# Patient Record
Sex: Female | Born: 1935 | Race: White | Hispanic: No | Smoking: Never smoker
Health system: Southern US, Community
[De-identification: ages and names within clinical notes are randomized; demographics above are authoritative.]

## PROBLEM LIST (undated history)

## (undated) DIAGNOSIS — I10 Essential (primary) hypertension: Secondary | ICD-10-CM

## (undated) DIAGNOSIS — K219 Gastro-esophageal reflux disease without esophagitis: Secondary | ICD-10-CM

## (undated) DIAGNOSIS — H409 Unspecified glaucoma: Secondary | ICD-10-CM

## (undated) DIAGNOSIS — H269 Unspecified cataract: Secondary | ICD-10-CM

## (undated) DIAGNOSIS — K649 Unspecified hemorrhoids: Secondary | ICD-10-CM

## (undated) DIAGNOSIS — IMO0002 Reserved for concepts with insufficient information to code with codable children: Secondary | ICD-10-CM

## (undated) DIAGNOSIS — N816 Rectocele: Secondary | ICD-10-CM

## (undated) DIAGNOSIS — I639 Cerebral infarction, unspecified: Secondary | ICD-10-CM

## (undated) DIAGNOSIS — F419 Anxiety disorder, unspecified: Secondary | ICD-10-CM

## (undated) DIAGNOSIS — T7840XA Allergy, unspecified, initial encounter: Secondary | ICD-10-CM

## (undated) HISTORY — DX: Cerebral infarction, unspecified: I63.9

## (undated) HISTORY — PX: EYE SURGERY: SHX253

## (undated) HISTORY — DX: Anxiety disorder, unspecified: F41.9

## (undated) HISTORY — DX: Rectocele: N81.6

## (undated) HISTORY — DX: Unspecified glaucoma: H40.9

## (undated) HISTORY — DX: Allergy, unspecified, initial encounter: T78.40XA

## (undated) HISTORY — DX: Essential (primary) hypertension: I10

## (undated) HISTORY — DX: Unspecified hemorrhoids: K64.9

## (undated) HISTORY — DX: Reserved for concepts with insufficient information to code with codable children: IMO0002

## (undated) HISTORY — DX: Gastro-esophageal reflux disease without esophagitis: K21.9

## (undated) HISTORY — DX: Unspecified cataract: H26.9

---

## 2003-01-10 ENCOUNTER — Ambulatory Visit (HOSPITAL_COMMUNITY): Admission: RE | Admit: 2003-01-10 | Discharge: 2003-01-10 | Payer: Self-pay | Admitting: Pulmonary Disease

## 2005-08-14 ENCOUNTER — Ambulatory Visit (HOSPITAL_COMMUNITY): Admission: RE | Admit: 2005-08-14 | Discharge: 2005-08-14 | Payer: Self-pay | Admitting: Pulmonary Disease

## 2007-04-30 ENCOUNTER — Ambulatory Visit (HOSPITAL_COMMUNITY): Admission: RE | Admit: 2007-04-30 | Discharge: 2007-04-30 | Payer: Self-pay | Admitting: Pulmonary Disease

## 2008-04-19 ENCOUNTER — Encounter: Payer: Self-pay | Admitting: Orthopedic Surgery

## 2008-04-19 ENCOUNTER — Ambulatory Visit (HOSPITAL_COMMUNITY): Admission: RE | Admit: 2008-04-19 | Discharge: 2008-04-19 | Payer: Self-pay | Admitting: Pulmonary Disease

## 2008-06-29 ENCOUNTER — Encounter (HOSPITAL_COMMUNITY): Admission: RE | Admit: 2008-06-29 | Discharge: 2008-07-29 | Payer: Self-pay | Admitting: Pulmonary Disease

## 2008-10-11 ENCOUNTER — Ambulatory Visit (HOSPITAL_COMMUNITY): Admission: RE | Admit: 2008-10-11 | Discharge: 2008-10-11 | Payer: Self-pay | Admitting: Pulmonary Disease

## 2008-11-02 ENCOUNTER — Other Ambulatory Visit: Admission: RE | Admit: 2008-11-02 | Discharge: 2008-11-02 | Payer: Self-pay | Admitting: Obstetrics and Gynecology

## 2008-12-01 HISTORY — PX: HIP SURGERY: SHX245

## 2008-12-08 ENCOUNTER — Encounter: Payer: Self-pay | Admitting: Orthopedic Surgery

## 2008-12-12 ENCOUNTER — Encounter: Payer: Self-pay | Admitting: Orthopedic Surgery

## 2008-12-12 ENCOUNTER — Ambulatory Visit (HOSPITAL_COMMUNITY): Admission: RE | Admit: 2008-12-12 | Discharge: 2008-12-12 | Payer: Self-pay | Admitting: Pulmonary Disease

## 2009-01-10 ENCOUNTER — Ambulatory Visit: Payer: Self-pay | Admitting: Orthopedic Surgery

## 2009-01-10 DIAGNOSIS — M169 Osteoarthritis of hip, unspecified: Secondary | ICD-10-CM | POA: Insufficient documentation

## 2009-01-10 DIAGNOSIS — M25559 Pain in unspecified hip: Secondary | ICD-10-CM | POA: Insufficient documentation

## 2009-01-10 DIAGNOSIS — M1610 Unilateral primary osteoarthritis, unspecified hip: Secondary | ICD-10-CM | POA: Insufficient documentation

## 2009-01-11 ENCOUNTER — Encounter: Payer: Self-pay | Admitting: Orthopedic Surgery

## 2009-01-22 ENCOUNTER — Ambulatory Visit: Payer: Self-pay | Admitting: Orthopedic Surgery

## 2009-02-20 ENCOUNTER — Ambulatory Visit: Payer: Self-pay | Admitting: Orthopedic Surgery

## 2009-02-20 ENCOUNTER — Inpatient Hospital Stay (HOSPITAL_COMMUNITY): Admission: RE | Admit: 2009-02-20 | Discharge: 2009-02-25 | Payer: Self-pay | Admitting: Orthopedic Surgery

## 2009-02-20 ENCOUNTER — Telehealth: Payer: Self-pay | Admitting: Orthopedic Surgery

## 2009-02-22 ENCOUNTER — Encounter: Payer: Self-pay | Admitting: Orthopedic Surgery

## 2009-02-26 ENCOUNTER — Encounter: Payer: Self-pay | Admitting: Orthopedic Surgery

## 2009-02-26 ENCOUNTER — Telehealth: Payer: Self-pay | Admitting: Orthopedic Surgery

## 2009-03-05 ENCOUNTER — Telehealth: Payer: Self-pay | Admitting: Orthopedic Surgery

## 2009-03-06 ENCOUNTER — Ambulatory Visit: Payer: Self-pay | Admitting: Orthopedic Surgery

## 2009-03-06 DIAGNOSIS — Z96649 Presence of unspecified artificial hip joint: Secondary | ICD-10-CM | POA: Insufficient documentation

## 2009-03-11 ENCOUNTER — Encounter: Payer: Self-pay | Admitting: Orthopedic Surgery

## 2009-03-12 ENCOUNTER — Encounter: Payer: Self-pay | Admitting: Orthopedic Surgery

## 2009-03-15 ENCOUNTER — Encounter: Payer: Self-pay | Admitting: Orthopedic Surgery

## 2009-03-28 ENCOUNTER — Telehealth: Payer: Self-pay | Admitting: Orthopedic Surgery

## 2009-03-29 ENCOUNTER — Encounter: Payer: Self-pay | Admitting: Orthopedic Surgery

## 2009-04-03 ENCOUNTER — Ambulatory Visit: Payer: Self-pay | Admitting: Orthopedic Surgery

## 2009-04-11 ENCOUNTER — Encounter (INDEPENDENT_AMBULATORY_CARE_PROVIDER_SITE_OTHER): Payer: Self-pay | Admitting: *Deleted

## 2009-04-12 ENCOUNTER — Encounter: Payer: Self-pay | Admitting: Orthopedic Surgery

## 2009-04-12 ENCOUNTER — Encounter (HOSPITAL_COMMUNITY): Admission: RE | Admit: 2009-04-12 | Discharge: 2009-05-12 | Payer: Self-pay | Admitting: Orthopedic Surgery

## 2009-04-23 ENCOUNTER — Encounter: Payer: Self-pay | Admitting: Orthopedic Surgery

## 2009-05-03 ENCOUNTER — Encounter: Payer: Self-pay | Admitting: Orthopedic Surgery

## 2009-05-08 ENCOUNTER — Telehealth: Payer: Self-pay | Admitting: Orthopedic Surgery

## 2009-05-16 ENCOUNTER — Ambulatory Visit: Payer: Self-pay | Admitting: Orthopedic Surgery

## 2009-05-18 ENCOUNTER — Ambulatory Visit (HOSPITAL_COMMUNITY): Admission: RE | Admit: 2009-05-18 | Discharge: 2009-05-18 | Payer: Self-pay | Admitting: Pulmonary Disease

## 2010-03-17 IMAGING — CT CT PELVIS W/ CM
1 of 3 series · 14 of 32 positions shown, 19 images · IV contrast (Omnipaque 300)
Comparison: 08/14/2005

CT ABDOMEN

CLINICAL DATA: Left flank pain and left groin pain

CT ABDOMEN AND PELVIS WITHOUT CONTRAST
TECHNIQUE: Multidetector CT imaging of the abdomen and pelvis was
performed following the standard
protocol without intravenous contrast. Sagittal and coronal images
reconstructed from axial data set.

[Series 2: abd_pel 5.0 b40f · axial · 0.69mm/px · z∈[-449,-79]mm · 14 of 84 slices shown, 19 images]
[im 5/84  soft-tissue]
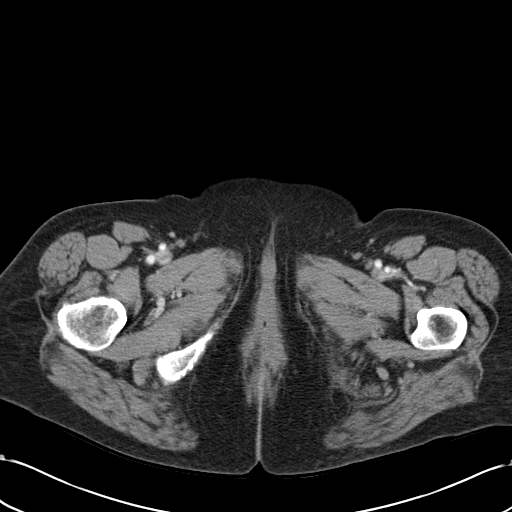
[im 5/84  bone]
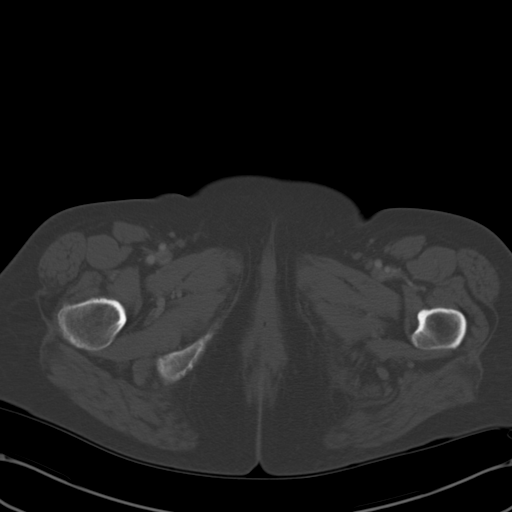
[im 10/84  soft-tissue]
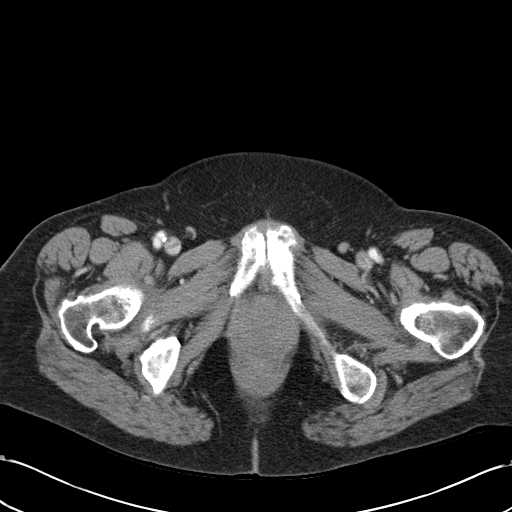
[im 19/84  soft-tissue]
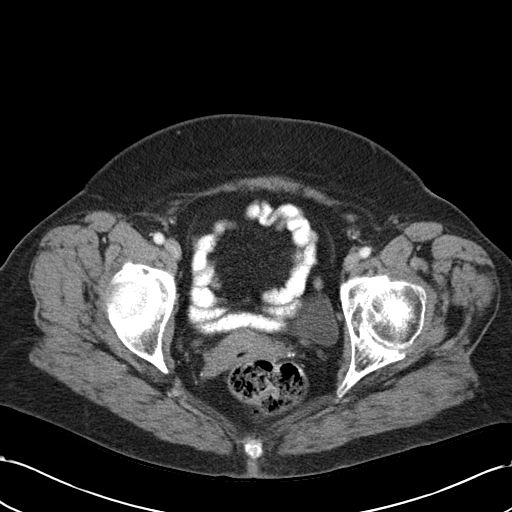
[im 24/84  soft-tissue]
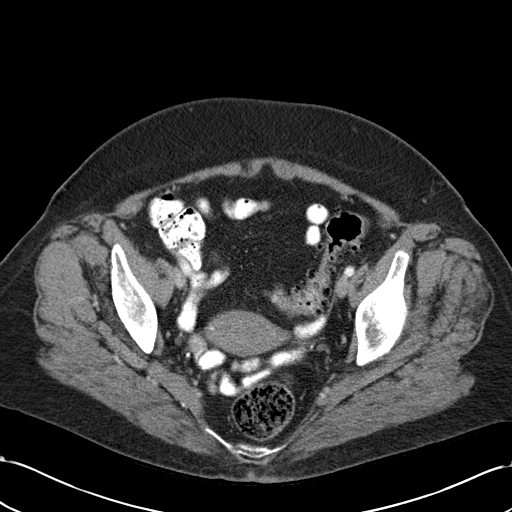
[im 28/84  soft-tissue]
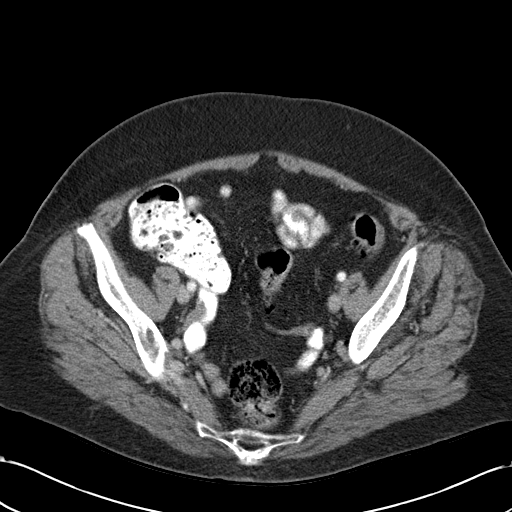
[im 37/84  soft-tissue]
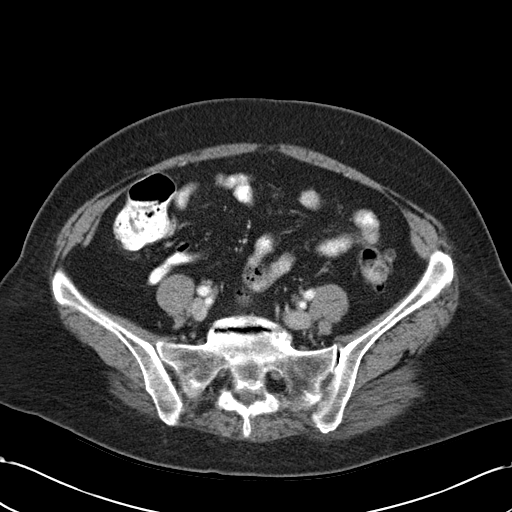
[im 42/84  soft-tissue]
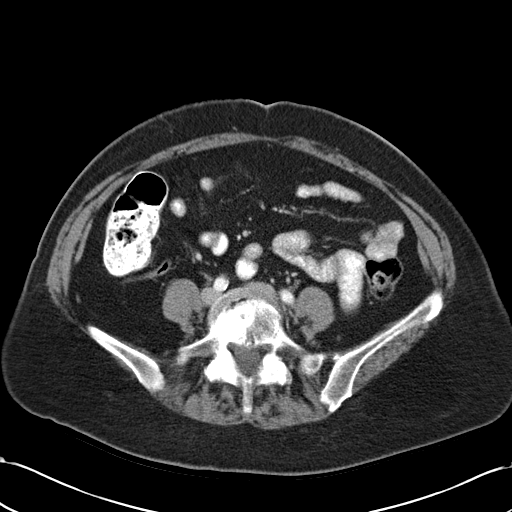
[im 47/84  soft-tissue]
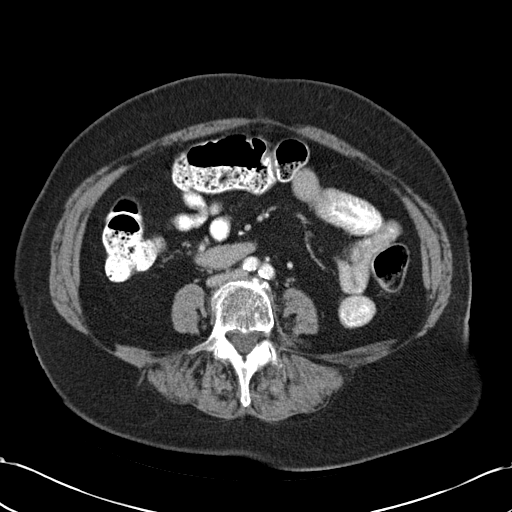
[im 56/84  soft-tissue]
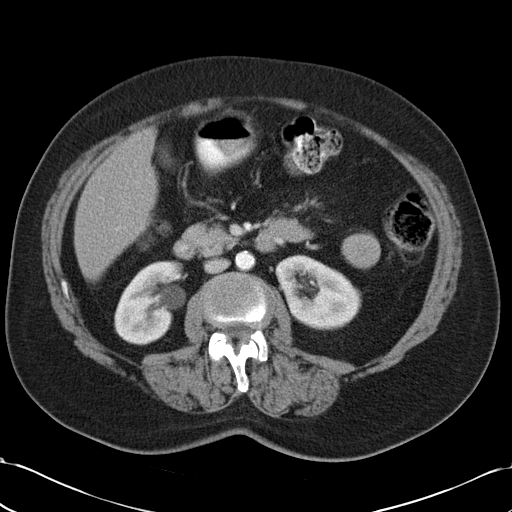
[im 56/84  bone]
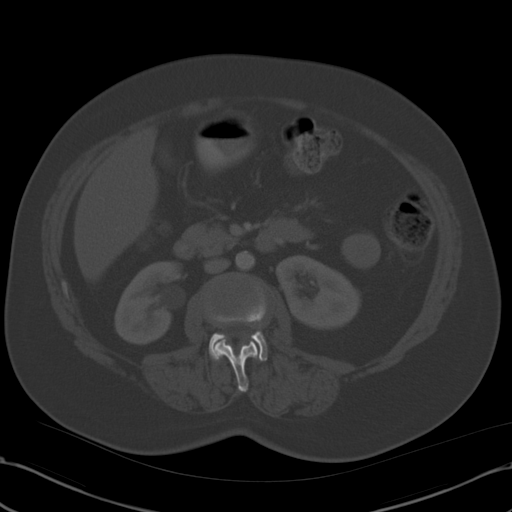
[im 60/84  soft-tissue]
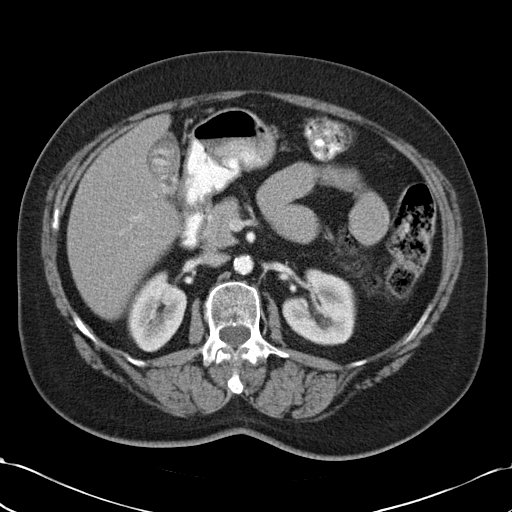
[im 65/84  soft-tissue]
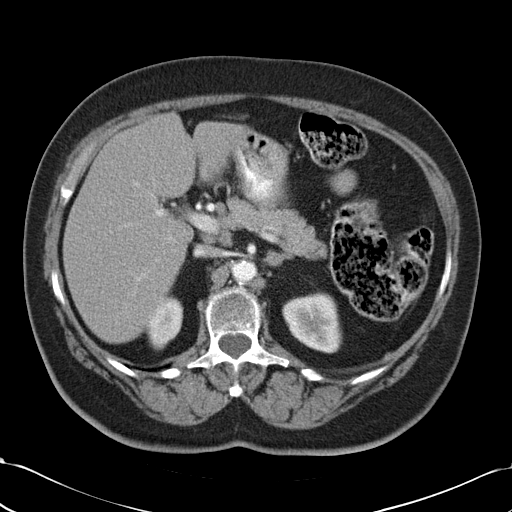
[im 65/84  lung]
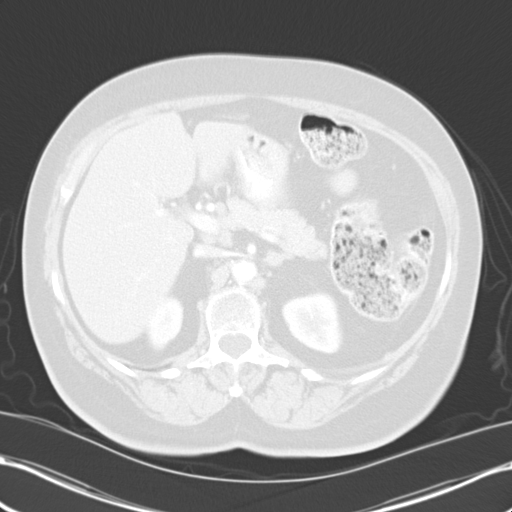
[im 70/84  lung]
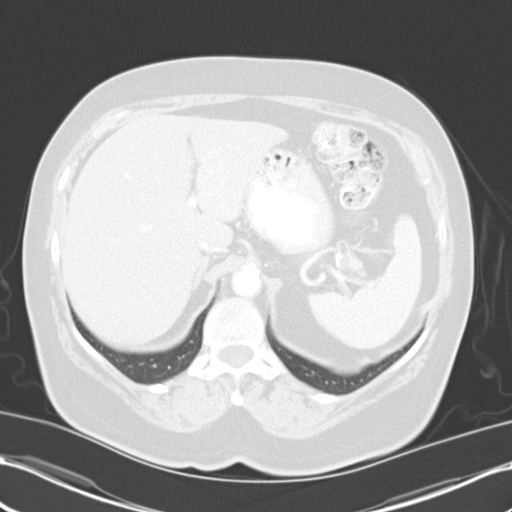
[im 74/84  soft-tissue]
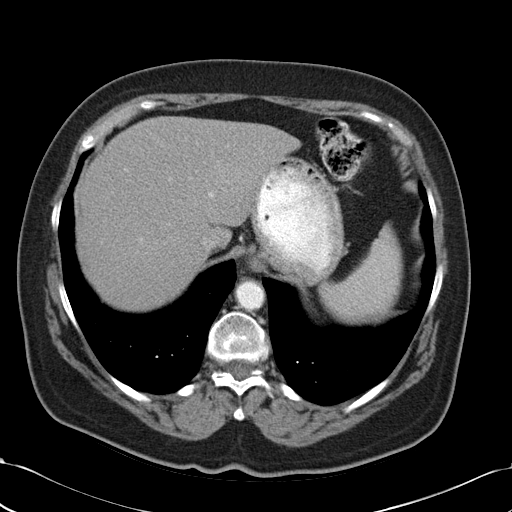
[im 74/84  lung]
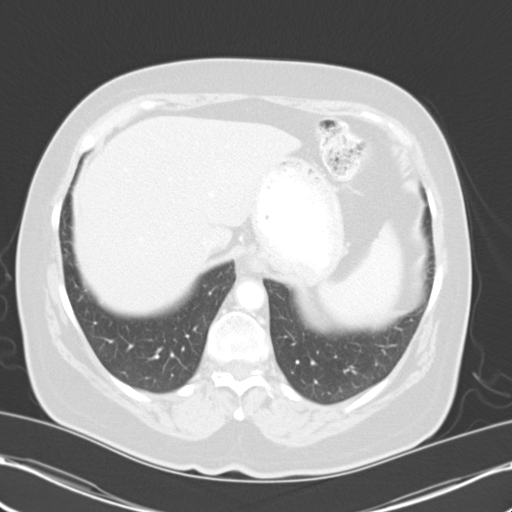
[im 79/84  soft-tissue]
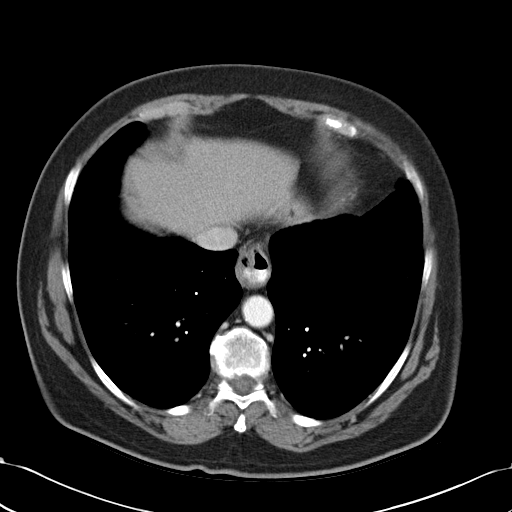
[im 79/84  lung]
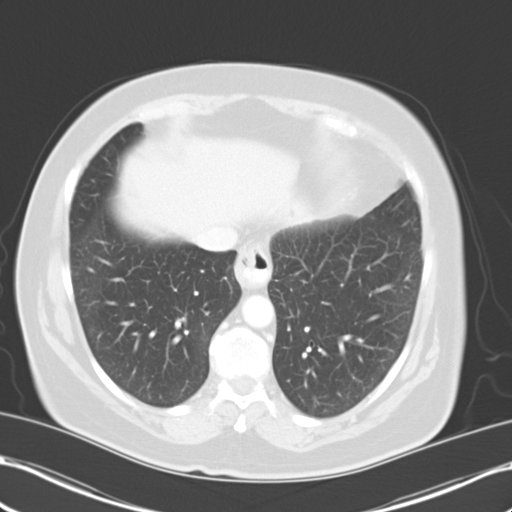

[14 of 32 positions shown; findings below may reference images not displayed]

FINDINGS: Tiny subpleural nodular density in lateral aspect left lower lobe
image one unchanged.
Tiny low attenuation nodule upper pole left kidney, 10 x 10 mm
image 13 unchanged.
Tiny benign appearing right adrenal calcification, question remote
hemorrhage or infection.
Liver, spleen, pancreas, kidneys, and adrenal glands otherwise
normal appearance.
Cholelithiasis.
No upper abdominal mass, adenopathy, free fluid, or inflammatory
process.
Stomach and upper abdominal bowel loops unremarkable.
Small hiatal hernia.
IMPRESSION: Small hiatal hernia.
Cholelithiasis
Tiny left renal cyst.
No acute upper abdominal abnormalities.

CT PELVIS
FINDINGS: Normal appendix, uterus, ovaries, and bladder.
Sigmoid diverticulosis without evidence of diverticulitis.
Distal ureters normal caliber without calculi.
No pelvic mass, adenopathy, or free fluid.
Multilevel degenerative disc disease changes lumbar spine, greatest
at L4-L5 and L5-S1 where end plate spurs, vacuum phenomenon, and
endplate sclerosis are noted.
Degenerative changes bilateral hips, greater on left.
IMPRESSION: No acute intrapelvic abnormalities.
Sigmoid diverticulosis.

## 2010-08-02 ENCOUNTER — Emergency Department (HOSPITAL_COMMUNITY): Admission: EM | Admit: 2010-08-02 | Discharge: 2010-08-02 | Payer: Self-pay | Admitting: Emergency Medicine

## 2010-12-22 ENCOUNTER — Encounter: Payer: Self-pay | Admitting: Pulmonary Disease

## 2011-03-13 LAB — DIFFERENTIAL
Basophils Absolute: 0 10*3/uL (ref 0.0–0.1)
Basophils Absolute: 0 10*3/uL (ref 0.0–0.1)
Eosinophils Absolute: 0.3 10*3/uL (ref 0.0–0.7)
Eosinophils Relative: 2 % (ref 0–5)
Eosinophils Relative: 3 % (ref 0–5)
Lymphocytes Relative: 13 % (ref 12–46)
Lymphocytes Relative: 14 % (ref 12–46)
Lymphocytes Relative: 14 % (ref 12–46)
Lymphs Abs: 1.5 10*3/uL (ref 0.7–4.0)
Monocytes Absolute: 1.2 10*3/uL — ABNORMAL HIGH (ref 0.1–1.0)
Monocytes Relative: 10 % (ref 3–12)
Monocytes Relative: 12 % (ref 3–12)
Neutro Abs: 6.7 10*3/uL (ref 1.7–7.7)
Neutro Abs: 7.6 10*3/uL (ref 1.7–7.7)
Neutrophils Relative %: 73 % (ref 43–77)
Neutrophils Relative %: 75 % (ref 43–77)

## 2011-03-13 LAB — BASIC METABOLIC PANEL
BUN: 7 mg/dL (ref 6–23)
CO2: 25 mEq/L (ref 19–32)
CO2: 26 mEq/L (ref 19–32)
Calcium: 8.3 mg/dL — ABNORMAL LOW (ref 8.4–10.5)
Calcium: 9.2 mg/dL (ref 8.4–10.5)
Chloride: 101 mEq/L (ref 96–112)
Chloride: 104 mEq/L (ref 96–112)
Chloride: 109 mEq/L (ref 96–112)
Creatinine, Ser: 0.68 mg/dL (ref 0.4–1.2)
Creatinine, Ser: 0.7 mg/dL (ref 0.4–1.2)
GFR calc Af Amer: >60 mL/min (ref >60)
GFR calc Af Amer: >60 mL/min (ref >60)
GFR calc Af Amer: >60 mL/min (ref >60)
GFR calc non Af Amer: >60 mL/min (ref >60)
Glucose, Bld: 113 mg/dL — ABNORMAL HIGH (ref 70–99)
Glucose, Bld: 129 mg/dL — ABNORMAL HIGH (ref 70–99)
Potassium: 4 mEq/L (ref 3.5–5.1)
Potassium: 4.1 mEq/L (ref 3.5–5.1)
Sodium: 136 mEq/L (ref 135–145)
Sodium: 137 mEq/L (ref 135–145)

## 2011-03-13 LAB — CROSSMATCH: ABO/RH(D): O POS

## 2011-03-13 LAB — CBC
HCT: 26.2 % — ABNORMAL LOW (ref 36.0–46.0)
HCT: 28.7 % — ABNORMAL LOW (ref 36.0–46.0)
Hemoglobin: 9 g/dL — ABNORMAL LOW (ref 12.0–15.0)
MCHC: 34.3 g/dL (ref 30.0–36.0)
MCV: 92.8 fL (ref 78.0–100.0)
Platelets: 130 10*3/uL — ABNORMAL LOW (ref 150–400)
Platelets: 142 10*3/uL — ABNORMAL LOW (ref 150–400)
RBC: 2.82 MIL/uL — ABNORMAL LOW (ref 3.87–5.11)
RBC: 2.82 MIL/uL — ABNORMAL LOW (ref 3.87–5.11)
RDW: 13.3 % (ref 11.5–15.5)
RDW: 13.4 % (ref 11.5–15.5)
WBC: 10.1 10*3/uL (ref 4.0–10.5)
WBC: 9.9 10*3/uL (ref 4.0–10.5)

## 2011-03-13 LAB — PREPARE RBC (CROSSMATCH)

## 2011-03-13 LAB — PROTIME-INR: Prothrombin Time: 12.4 seconds (ref 11.6–15.2)

## 2011-03-13 LAB — HEMOGLOBIN AND HEMATOCRIT, BLOOD: HCT: 38.1 % (ref 36.0–46.0)

## 2011-04-15 NOTE — Group Therapy Note (Signed)
[name], Krista Munoz               ACCOUNT NO.:  [id number]   MEDICAL RECORD NO.:  [id number]          PATIENT TYPE:  INP   LOCATION:  A315                          FACILITY:  [facility]   PHYSICIAN:  Vickki Hearing, M.D.DATE OF BIRTH:  July 13, 1931   DATE OF PROCEDURE:  DATE OF DISCHARGE:                                 PROGRESS NOTE   She is postop day 2, status post left total hip arthroplasty.  She is  scheduled for a CT scan this morning.  Her hemoglobin is now 9 after a  unit of blood yesterday.  We gave her a second unit today.  We would  like her to get up today, stand, maybe take a couple of steps, and start  preparing for discharge planning.  She is stable and improved after  stopping Dilaudid and going with hydrocodone as a pain reliever.      Vickki Hearing, M.D.  Electronically Signed     SEH/MEDQ  D:  02/22/2009  T:  02/22/2009  Job:  [id number]

## 2011-04-15 NOTE — Group Therapy Note (Signed)
NAMEALANTE, WEIMANN NO.:  [id number]   MEDICAL RECORD NO.:  [id number]          PATIENT TYPE:  INP   LOCATION:  A315                          FACILITY:  [facility]   PHYSICIAN:  Vickki Hearing, M.D.DATE OF BIRTH:  25-Mar-1931   DATE OF PROCEDURE:  02/23/2009  DATE OF DISCHARGE:                                 PROGRESS NOTE   Lillee Mooneyhan is postop day 3, status post left total hip arthroplasty.  Yesterday, we did a CT scan with three-dimensional reconstruction to  evaluate acetabular cup position, which showed she does have adequate  version in her acetabular cup, so she will continue to be on posterior  hip precautions.  Her safe arc of motion is 90 degrees of flexion and 20  degrees internal rotation.  She should not have any internal rotation  past that point.  On the operating table, she was stable to 40 degrees  of internal rotation.   Her wound looks good.  The pain pump catheter was removed.   Her lab studies show a hemoglobin of 9.8 after the second unit of blood.  Her metabolic panel was normal with a glucose of 113.   Orthopedic orders for her are full weightbearing, posterior hip  precautions, staples remove postop day #10-12, evaluate and remove  staples on appropriate day, 1 month of DVT prophylaxis with enoxaparin  40 mg subcu q.24 h., any questions call [phone].  The patient is having  appointment scheduled for March 06, 2009, in the office for checkup.      Vickki Hearing, M.D.  Electronically Signed     SEH/MEDQ  D:  02/23/2009  T:  02/24/2009  Job:  [id number]

## 2011-04-15 NOTE — Group Therapy Note (Signed)
[name], Krista Munoz               ACCOUNT NO.:  [id number]   MEDICAL RECORD NO.:  [id number]         PATIENT TYPE:  PINP   LOCATION:  A315                          FACILITY:  [facility]   PHYSICIAN:  Vickki Hearing, M.D.DATE OF BIRTH:  09/09/1931   DATE OF PROCEDURE:  DATE OF DISCHARGE:                                 PROGRESS NOTE   Postop day 3, status post left total hip arthroplasty, status post three-  dimensional CT scan to assess cup position.  Cup position found to be 29  degrees of anteversion versus a native acetabulum of 21 degrees on the  opposite side indicating good cup position.   The patient is in good condition.  Her hemoglobin is 9.8.  Her basic  metabolic panel was essentially normal.  She had stable vital signs.  She had an intact, clean wound.  Pain pump catheter was removed.   POSTOP PLAN:  The patient will go home with her family.  She will go  home on Coumadin, and that will stay for one month.  She is full  weightbearing as tolerated.  She is on posterior hip precautions with no  internal rotation and no hip flexion past 90 degrees.   Abduction pillow stays for 6 weeks.  TED hose stays for 6 weeks.  Coumadin continuous for 30 days postop from surgery.      Vickki Hearing, M.D.  Electronically Signed     SEH/MEDQ  D:  02/23/2009  T:  02/24/2009  Job:  [id number]

## 2011-04-15 NOTE — Op Note (Signed)
NAMELINNEA, TODISCO               ACCOUNT NO.:  [id number]   MEDICAL RECORD NO.:  [id number]          PATIENT TYPE:  INP   LOCATION:  A315                          FACILITY:  [facility]   PHYSICIAN:  Vickki Hearing, M.D.DATE OF BIRTH:  05/26/1931   DATE OF PROCEDURE:  DATE OF DISCHARGE:                               OPERATIVE REPORT   ADDENDUM   OPERATIVE FINDINGS:  Severe arthritis of the hip joint with severe  osteophyte formation, valgus deformity of the femoral neck, anterior and  posterior osteophytes obscuring the orientation of the acetabulum.  Severe wear of the femoral head as well as the acetabulum.  Hypertrophic  redundant capsule, which was hyperemic.      Vickki Hearing, M.D.  Electronically Signed     SEH/MEDQ  D:  02/20/2009  T:  02/21/2009  Job:  [id number]

## 2011-04-15 NOTE — Group Therapy Note (Signed)
[name], Krista Munoz               ACCOUNT NO.:  [id number]   MEDICAL RECORD NO.:  [id number]          PATIENT TYPE:  INP   LOCATION:  A315                          FACILITY:  [facility]   PHYSICIAN:  Vickki Hearing, M.D.DATE OF BIRTH:  01-04-1931   DATE OF PROCEDURE:  DATE OF DISCHARGE:                                 PROGRESS NOTE   She is postop day #1, status post left total hip arthroplasty.  She has  had some low blood pressure, hemoglobin is 8.8.  She will be on Lovenox  and foot pumps.  She is doing well, having mild-to-moderate pain,  controlled with PCA.   I would like to hold the Benicar this morning, transfuse a unit of  blood, we will get a 3-D CAT scan tomorrow to assess the position of her  cups until it is difficult to assess interop and she can get out of bed  to chair today with posterior hip precautions as noted in the record.      Vickki Hearing, M.D.  Electronically Signed     SEH/MEDQ  D:  02/21/2009  T:  02/21/2009  Job:  [id number]

## 2011-04-15 NOTE — Op Note (Signed)
NAMESPARROW, SIRACUSA NO.:  [id number]   MEDICAL RECORD NO.:  [id number]          PATIENT TYPE:  INP   LOCATION:  A315                          FACILITY:  [facility]   PHYSICIAN:  Vickki Hearing, M.D.DATE OF BIRTH:  [birth year]   DATE OF PROCEDURE:  02/20/2009  DATE OF DISCHARGE:                               OPERATIVE REPORT   HISTORY:  This is a 77 year old female presented with disabling pain in  her left hip.  She was becoming almost non-ambulatory.  She was having  severe trouble with activities of daily living.  She was unresponsive to  pain medication.   PREOPERATIVE DIAGNOSIS:  Osteoarthritis, left hip.   POSTOPERATIVE DIAGNOSIS:  Osteoarthritis, left hip.   PROCEDURE:  Left total hip arthroplasty.   IMPLANTS:  DePuy 11 Corail stem, 36 hip ball, +12 high offset neck  with  a 10 degrees liner.  We used a 52 cup with 2 screws and we also used the  autogenous graft from the reaming of the acetabulum for a medial wall  insufficiency   DESCRIPTION OF PROCEDURE:  The patient was identified preoperatively.  She marked her left hip and then I countersigned it and looked at her  history and physical updated and she was taken to surgery where she had  a preoperative antibiotic, spinal anesthetic, Foley catheter and then  was placed in lateral decubitus position with the left side up with an  axillary roll.   Slightly curved incision was made over the greater trochanter, extended  proximally and distally.  Subcutaneous tissue was divided, fascia was  split in line with the skin incision and the abductors were removed from  the greater trochanter and subperiosteal fashion.  The abductors were  retracted superiorly and held with 2 Steinmann pins.  Hip was dislocated  anteriorly via capsulotomy and internal rotation.  Provisional neck cut  was made with a neck cutting guide leaving a generous neck length.  We  then removed the inner portion of the greater  trochanter, used a canal  finder to the find the canal, broached from a size 8 up to a size 11.   We then prepared the acetabulum for reaming by removing soft tissue  labrum and osteophytes, reamed up to a size 52, placed a trial 52 cup  with a 36 liner in place, we tried a 32 head and a 1.5 neck length up to  a size 13, but the hip continued to dislocate posteriorly so we changed  the cup version to increase the anteversion and at that point, I noticed  that the trochanter was impinging on the pelvis, so we went back up to a  36 hip ball, tried neck lengths up to 12.  I obtained good tension on  the abductors, but still had impinging trochanter on the pelvis.  We  then removed bone from the trochanter.  This improved our range of  motion.  We then accepted this and our implant position, throughout this  we took several x-rays to confirm position of the implants.   We also used a acetabular positioning device  to measure anteversion and  we did obtain approximately 15-20 degrees of anteversion for a combined  anteversion of 30 degrees.   The posterior 10 degrees lip liner was used posteriorly to improve hip  stability.   I then removed all trial components, placed the 52 cup with 2 screws, 10  degrees liner placed posteriorly.  We placed a 11 Corail stem, +12-36  hip ball then repeated the range of motion.  The hip dislocated at 90  degrees of flexion and 40 degrees of internal rotation. This matched the  maximum amount of ROM we obtained during the trial reduction.   The wound was irrigated.  The abductors were closed by passing sutures  through the trochanter and femur and then reattaching the abductors  anatomically.  We also placed running #1 braylon stitch to assist with  this.   I closed the fascia in interrupted fashion with #1 braylon.  We also  used  running stitches as well as.  We closed the subcu with 0 and 2-0  Monocryl.   Staples were applied to the skin.  We used a  pain pump catheter and 30  mL of Marcaine by injection.   The patient was placed in abduction pillow; Prior to doing this we did  check for leg lengths and they were equal.   POSTOPERATIVE PLAN:  The patient will have posterior hip precautions due  to the range of motion and stability achieved on the table, which will  include no flexion pass 90 for 6 weeks and no internal rotation of the  leg.  Otherwise postop plan will be as normal.   This was discussed with her daughter who is her primary go between,  between  herself and the physician.  We will obtain a postop three-  dimensional CAT scan to review the position of the cup.      Vickki Hearing, M.D.  Electronically Signed     SEH/MEDQ  D:  02/20/2009  T:  02/21/2009  Job:  [id number]

## 2011-04-15 NOTE — Group Therapy Note (Signed)
NAMEKALINDA, ROMANIELLO               ACCOUNT NO.:  [id number]   MEDICAL RECORD NO.:  [id number]          PATIENT TYPE:  INP   LOCATION:  A315                          FACILITY:  [facility]   PHYSICIAN:  Vickki Hearing, M.D.DATE OF BIRTH:  10-20-1931   DATE OF PROCEDURE:  DATE OF DISCHARGE:                                 PROGRESS NOTE   On postop day #3, status post left total hip arthroplasty, status post  CT scan evaluation of acetabular cup position.  Cup position found to be  normal.  The patient should be on posterior hip precautions with flexion  limited to 90 degrees, no internal rotation.   Safe arc of motion in the operating room was 90 degrees of flexion, and  40 degrees of internal rotation, but no internal rotation should be  performed with therapy and flexion should not go past 90 degrees.   Wound looks good.  Hemoglobin is 9.8 after 2 units of blood.   PLAN:  Full weightbearing, posterior hip precautions, limit flexion to  90 degrees.  Remove staples postop day 10-12.  Evaluate, remove as  appropriate.  One month prophylaxis with enoxaparin.  Follow up in a  month.  Call [phone] for any questions.      Vickki Hearing, M.D.  Electronically Signed     SEH/MEDQ  D:  02/23/2009  T:  02/24/2009  Job:  [id number]

## 2011-04-15 NOTE — Discharge Summary (Signed)
Krista Munoz, READER               ACCOUNT NO.:  [id number]   MEDICAL RECORD NO.:  [id number]          PATIENT TYPE:  INP   LOCATION:  A315                          FACILITY:  [facility]   PHYSICIAN:  Vickki Hearing, M.D.DATE OF BIRTH:  07/08/1931   DATE OF ADMISSION:  02/20/2009  DATE OF DISCHARGE:  03/28/2010LH                               DISCHARGE SUMMARY   ADMISSION DIAGNOSIS:  Osteoarthritis, left hip.   DISCHARGE DIAGNOSES:  1. Osteoarthritis, left hip.  2. Postoperative anemia.   OPERATIVE PROCEDURE:  Left total hip arthroplasty with Depuy Corail stem  and 52 Pinnacle cup metal-on-poly, 36 head and 12 neck.   SURGEON:  Vickki Hearing, MD   ANESTHETIC:  Spinal.   OPERATIVE FINDINGS:  Redundant capsule, acetabular dysplasia, multiple  osteophytes, and severe arthritis.   HOSPITAL COURSE:  The patient was admitted on 20 February 2009, underwent  left total hip arthroplasty, complicated by abnormal acetabular  morphology.   She did well on postop, had some anemia, had to be transfused 2 units of  blood, hemoglobin came up to 9.8, blood pressure medicine was held due  to low blood pressure, and this worked well as she responded with blood  pressures of 120-140/60-70.   She had a 3-D reconstruction CT scan of her hip postop to evaluate cup  position which showed that the cup was in 29 degrees of anteversion.   The patient did well postoperatively, progressively mobilizing well.   She will be discharged with the following medicines.  1. OsCal 500 mg daily.  2. Prevacid 15 mg daily.  3. Alphagan 1 drop to each eye twice a day.  4. Lumigan 0.03% at bedtime, both eyes.  5. Centrum Silver 1 a day.  6. Dorzolamide and timolol 2-0.5 to both eyes.  7. Xanax 0.5 mg half tablet daily.  8. Norco 5 mg p.r.n. for pain.  9. Coumadin 5 mg alternating with 7.5 mg every other day with 7.5 mg      on Monday, Wednesday, Friday, Sunday, and 5 on Tuesday, Thursday,      and  Saturday.   Protime needed on Mondays.  The patient will take Coumadin for 25 days.   The patient will follow up on March 06, 2009.   Discharged to home.   The patient has full weightbearing with posterior hip precautions and  limit flexion to 90 degrees with no internal rotation, abduction pillow  for 6 weeks, TED stockings for 6 weeks, may remove for an hour every 8  hours if needed.   CONDITION:  Improved.  Discharged to home.      Vickki Hearing, M.D.  Electronically Signed     SEH/MEDQ  D:  02/24/2009  T:  02/24/2009  Job:  [id number]

## 2013-05-06 DIAGNOSIS — Z961 Presence of intraocular lens: Secondary | ICD-10-CM | POA: Insufficient documentation

## 2013-09-05 ENCOUNTER — Other Ambulatory Visit (HOSPITAL_COMMUNITY)
Admission: RE | Admit: 2013-09-05 | Discharge: 2013-09-05 | Disposition: A | Discharge status: Home / Self Care | Payer: Medicare Other | Source: Ambulatory Visit | Attending: Adult Health | Admitting: Adult Health

## 2013-09-05 ENCOUNTER — Ambulatory Visit (INDEPENDENT_AMBULATORY_CARE_PROVIDER_SITE_OTHER): Payer: Medicare Other | Admitting: Adult Health

## 2013-09-05 ENCOUNTER — Encounter: Payer: Self-pay | Admitting: Adult Health

## 2013-09-05 VITALS — BP 112/66 | HR 72 | Ht 61.0 in | Wt 140.0 lb

## 2013-09-05 DIAGNOSIS — N816 Rectocele: Secondary | ICD-10-CM

## 2013-09-05 DIAGNOSIS — Z1212 Encounter for screening for malignant neoplasm of rectum: Secondary | ICD-10-CM

## 2013-09-05 DIAGNOSIS — IMO0002 Reserved for concepts with insufficient information to code with codable children: Secondary | ICD-10-CM

## 2013-09-05 DIAGNOSIS — Z1151 Encounter for screening for human papillomavirus (HPV): Secondary | ICD-10-CM | POA: Insufficient documentation

## 2013-09-05 DIAGNOSIS — K649 Unspecified hemorrhoids: Secondary | ICD-10-CM | POA: Insufficient documentation

## 2013-09-05 DIAGNOSIS — Z01419 Encounter for gynecological examination (general) (routine) without abnormal findings: Secondary | ICD-10-CM

## 2013-09-05 DIAGNOSIS — N814 Uterovaginal prolapse, unspecified: Secondary | ICD-10-CM | POA: Insufficient documentation

## 2013-09-05 DIAGNOSIS — Z124 Encounter for screening for malignant neoplasm of cervix: Secondary | ICD-10-CM

## 2013-09-05 HISTORY — DX: Rectocele: N81.6

## 2013-09-05 HISTORY — DX: Unspecified hemorrhoids: K64.9

## 2013-09-05 HISTORY — DX: Reserved for concepts with insufficient information to code with codable children: IMO0002

## 2013-09-05 LAB — HEMOCCULT GUIAC POC 1CARD (OFFICE): Fecal Occult Blood, POC: NEGATIVE

## 2013-09-05 NOTE — Patient Instructions (Addendum)
Physical in 2 years Get mammogram in [employer] with PCP Try prep H or anusol  supp or cream if needed NO heavy lifting, more than 20 lbs

## 2013-09-05 NOTE — Progress Notes (Signed)
Patient ID: Krista Munoz, female   DOB: [birth year], 79 y.o.   MRN: [id number] History of Present Illness: Krista Munoz is a 79 year old white female widowed in for pap and physical.She thinks she thinks" something has dropped down there".She does yard work and lifts heavy things and that is when she notices it.   Current Medications, Allergies, Past Medical History, Past Surgical History, Family History and Social History were reviewed in [employer] record.     Review of Systems: Patient denies any headaches, blurred vision, shortness of breath, chest pain, abdominal pain, problems with bowel movements, urination, or intercourse. She is not having sex, occasional may wet panties if has to go real bad in am, stool hard at times.No mood changes, no joint swelling.She moves well.Gasy at times.    Physical Exam:BP 112/66  Pulse 72  Ht 5\' 1"  (1.549 m)  Wt 140 lb (63.504 kg)  BMI 26.47 kg/m2 General:  Well developed, well nourished, no acute distress Skin:  Warm and dry Neck:  Midline trachea, normal thyroid, no carotid bruits heard Lungs; Clear to auscultation bilaterally Breast:  No dominant palpable mass, retraction, or nipple discharge Cardiovascular: Regular rate and rhythm Abdomen:  Soft, non tender, no hepatosplenomegaly Pelvic:  External genitalia is normal in appearance for age has a few comodones.  The vagina is atrophic in appearance. Has mild cystocele. The cervix is atrophic,pap with HPV performed.  Uterus is felt to be normal size, shape, and contour.  No adnexal masses or tenderness noted. Rectal: Good sphincter tone, no polyps,internal  hemorrhoids felt.  Hemoccult negative.+rectocele. Extremities:  No swelling  Noted, has some spider veins bilaterally Psych:  No mood changes, alert and cooperative,seems happy   Impression: Yearly gyn exam Cystocele Rectocele Hemorrhoids  Plan: Physical in 2 years if desired,if pap normal no more Get mammogram Labs  with PCP---Dr Juanetta Gosling Declines flu shot Review handout on cystocele and rectocele, no heavy lifting above 20 lbs Try preparation H or Anusol, can drink 1/2 cup prune juice daily or eat 4-5 prunes

## 2013-10-26 ENCOUNTER — Emergency Department (HOSPITAL_COMMUNITY): Payer: Medicare Other

## 2013-10-26 ENCOUNTER — Encounter (HOSPITAL_COMMUNITY): Payer: Self-pay | Admitting: Emergency Medicine

## 2013-10-26 ENCOUNTER — Emergency Department (HOSPITAL_COMMUNITY)
Admission: EM | Admit: 2013-10-26 | Discharge: 2013-10-26 | Disposition: A | Discharge status: Home / Self Care | Payer: Medicare Other | Attending: Emergency Medicine | Admitting: Emergency Medicine

## 2013-10-26 DIAGNOSIS — R0789 Other chest pain: Secondary | ICD-10-CM

## 2013-10-26 DIAGNOSIS — Z8742 Personal history of other diseases of the female genital tract: Secondary | ICD-10-CM | POA: Insufficient documentation

## 2013-10-26 DIAGNOSIS — Z9889 Other specified postprocedural states: Secondary | ICD-10-CM | POA: Insufficient documentation

## 2013-10-26 DIAGNOSIS — S59909A Unspecified injury of unspecified elbow, initial encounter: Secondary | ICD-10-CM | POA: Insufficient documentation

## 2013-10-26 DIAGNOSIS — F411 Generalized anxiety disorder: Secondary | ICD-10-CM | POA: Insufficient documentation

## 2013-10-26 DIAGNOSIS — I10 Essential (primary) hypertension: Secondary | ICD-10-CM | POA: Insufficient documentation

## 2013-10-26 DIAGNOSIS — S638X2A Sprain of other part of left wrist and hand, initial encounter: Secondary | ICD-10-CM

## 2013-10-26 DIAGNOSIS — S63519A Sprain of carpal joint of unspecified wrist, initial encounter: Secondary | ICD-10-CM | POA: Insufficient documentation

## 2013-10-26 DIAGNOSIS — M25552 Pain in left hip: Secondary | ICD-10-CM

## 2013-10-26 DIAGNOSIS — Y92410 Unspecified street and highway as the place of occurrence of the external cause: Secondary | ICD-10-CM | POA: Insufficient documentation

## 2013-10-26 DIAGNOSIS — S8392XA Sprain of unspecified site of left knee, initial encounter: Secondary | ICD-10-CM

## 2013-10-26 DIAGNOSIS — K219 Gastro-esophageal reflux disease without esophagitis: Secondary | ICD-10-CM | POA: Insufficient documentation

## 2013-10-26 DIAGNOSIS — S6990XA Unspecified injury of unspecified wrist, hand and finger(s), initial encounter: Secondary | ICD-10-CM | POA: Insufficient documentation

## 2013-10-26 DIAGNOSIS — Z8669 Personal history of other diseases of the nervous system and sense organs: Secondary | ICD-10-CM | POA: Insufficient documentation

## 2013-10-26 DIAGNOSIS — Z79899 Other long term (current) drug therapy: Secondary | ICD-10-CM | POA: Insufficient documentation

## 2013-10-26 DIAGNOSIS — IMO0002 Reserved for concepts with insufficient information to code with codable children: Secondary | ICD-10-CM | POA: Insufficient documentation

## 2013-10-26 DIAGNOSIS — S139XXA Sprain of joints and ligaments of unspecified parts of neck, initial encounter: Secondary | ICD-10-CM

## 2013-10-26 DIAGNOSIS — S298XXA Other specified injuries of thorax, initial encounter: Secondary | ICD-10-CM | POA: Insufficient documentation

## 2013-10-26 DIAGNOSIS — Y9389 Activity, other specified: Secondary | ICD-10-CM | POA: Insufficient documentation

## 2013-10-26 DIAGNOSIS — S8391XA Sprain of unspecified site of right knee, initial encounter: Secondary | ICD-10-CM

## 2013-10-26 DIAGNOSIS — J45909 Unspecified asthma, uncomplicated: Secondary | ICD-10-CM | POA: Insufficient documentation

## 2013-10-26 DIAGNOSIS — S0990XA Unspecified injury of head, initial encounter: Secondary | ICD-10-CM

## 2013-10-26 DIAGNOSIS — S79919A Unspecified injury of unspecified hip, initial encounter: Secondary | ICD-10-CM | POA: Insufficient documentation
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
## 2013-10-26 NOTE — ED Notes (Signed)
Pt still in Xray dept
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
## 2013-10-26 NOTE — ED Notes (Signed)
Pt cleared from LSB with Dr. Bebe Shaggy and NT Haywood Lasso using log roll

## 2013-10-26 NOTE — ED Notes (Signed)
Pt ambulated around nurses desk without difficulty

## 2013-10-26 NOTE — ED Notes (Signed)
denies hitting head, denies LOC

## 2013-10-26 NOTE — ED Notes (Signed)
Pt placed on LSB and c-collar PTA

## 2013-11-01 ENCOUNTER — Ambulatory Visit (INDEPENDENT_AMBULATORY_CARE_PROVIDER_SITE_OTHER): Payer: Medicare Other | Admitting: Orthopedic Surgery

## 2013-11-01 VITALS — BP 158/78 | Ht 61.0 in | Wt 142.0 lb

## 2013-11-01 DIAGNOSIS — M1990 Unspecified osteoarthritis, unspecified site: Secondary | ICD-10-CM

## 2013-11-01 DIAGNOSIS — M129 Arthropathy, unspecified: Secondary | ICD-10-CM

## 2013-11-01 MED ORDER — MENTHOL (TOPICAL ANALGESIC) 4 % EX GEL: CUTANEOUS | Status: DC

## 2013-11-01 NOTE — Patient Instructions (Signed)
APPLY BIOFREEZE 3 TIMES DAILY ON [facility] JOINT
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
## 2014-05-02 ENCOUNTER — Ambulatory Visit (HOSPITAL_COMMUNITY)
Admission: RE | Admit: 2014-05-02 | Discharge: 2014-05-02 | Disposition: A | Discharge status: Home / Self Care | Payer: Medicare HMO | Source: Ambulatory Visit | Attending: Pulmonary Disease | Admitting: Pulmonary Disease

## 2014-05-02 ENCOUNTER — Other Ambulatory Visit (HOSPITAL_COMMUNITY): Payer: Self-pay | Admitting: Pulmonary Disease

## 2014-05-02 DIAGNOSIS — R059 Cough, unspecified: Secondary | ICD-10-CM

## 2014-05-02 DIAGNOSIS — R05 Cough: Secondary | ICD-10-CM | POA: Insufficient documentation

## 2014-07-19 ENCOUNTER — Telehealth: Payer: Self-pay | Admitting: Orthopedic Surgery

## 2014-07-19 NOTE — Telephone Encounter (Signed)
Patient had called, and then came in, to request copy of the only office visit she had by Dr. Romeo AppleHarrison (date of service 11/01/13); request form signed, and scanned; notes printed by nurse; patient received as requested.

## 2014-10-02 ENCOUNTER — Encounter (HOSPITAL_COMMUNITY): Payer: Self-pay | Admitting: Emergency Medicine

## 2016-05-13 ENCOUNTER — Ambulatory Visit (HOSPITAL_COMMUNITY)
Admission: RE | Admit: 2016-05-13 | Discharge: 2016-05-13 | Disposition: A | Discharge status: Home / Self Care | Payer: Commercial Managed Care - HMO | Source: Ambulatory Visit | Attending: Pulmonary Disease | Admitting: Pulmonary Disease

## 2016-05-13 ENCOUNTER — Other Ambulatory Visit (HOSPITAL_COMMUNITY): Payer: Self-pay | Admitting: Pulmonary Disease

## 2016-05-13 DIAGNOSIS — M25561 Pain in right knee: Secondary | ICD-10-CM | POA: Diagnosis present

## 2016-05-13 DIAGNOSIS — M179 Osteoarthritis of knee, unspecified: Secondary | ICD-10-CM | POA: Diagnosis not present

## 2016-10-09 ENCOUNTER — Other Ambulatory Visit (HOSPITAL_COMMUNITY): Payer: Self-pay | Admitting: Pulmonary Disease

## 2016-10-09 ENCOUNTER — Emergency Department (HOSPITAL_COMMUNITY): Payer: Commercial Managed Care - HMO

## 2016-10-09 ENCOUNTER — Other Ambulatory Visit: Payer: Self-pay

## 2016-10-09 ENCOUNTER — Encounter (HOSPITAL_COMMUNITY): Payer: Self-pay

## 2016-10-09 ENCOUNTER — Inpatient Hospital Stay (HOSPITAL_COMMUNITY)
Admission: EM | Admit: 2016-10-09 | Discharge: 2016-10-11 | DRG: 065 | Disposition: A | Discharge status: Skilled Nursing Facility | Payer: Commercial Managed Care - HMO | Attending: Pulmonary Disease | Admitting: Pulmonary Disease

## 2016-10-09 ENCOUNTER — Ambulatory Visit (HOSPITAL_COMMUNITY)
Admission: RE | Admit: 2016-10-09 | Discharge: 2016-10-09 | Disposition: A | Discharge status: Home / Self Care | Payer: Commercial Managed Care - HMO | Source: Ambulatory Visit | Attending: Pulmonary Disease | Admitting: Pulmonary Disease

## 2016-10-09 DIAGNOSIS — R29898 Other symptoms and signs involving the musculoskeletal system: Secondary | ICD-10-CM

## 2016-10-09 DIAGNOSIS — I10 Essential (primary) hypertension: Secondary | ICD-10-CM | POA: Diagnosis not present

## 2016-10-09 DIAGNOSIS — Z841 Family history of disorders of kidney and ureter: Secondary | ICD-10-CM

## 2016-10-09 DIAGNOSIS — I639 Cerebral infarction, unspecified: Secondary | ICD-10-CM | POA: Diagnosis not present

## 2016-10-09 DIAGNOSIS — Z8261 Family history of arthritis: Secondary | ICD-10-CM

## 2016-10-09 DIAGNOSIS — H409 Unspecified glaucoma: Secondary | ICD-10-CM | POA: Diagnosis present

## 2016-10-09 DIAGNOSIS — Z79899 Other long term (current) drug therapy: Secondary | ICD-10-CM

## 2016-10-09 DIAGNOSIS — Z818 Family history of other mental and behavioral disorders: Secondary | ICD-10-CM

## 2016-10-09 DIAGNOSIS — Z96649 Presence of unspecified artificial hip joint: Secondary | ICD-10-CM | POA: Diagnosis present

## 2016-10-09 DIAGNOSIS — Z825 Family history of asthma and other chronic lower respiratory diseases: Secondary | ICD-10-CM

## 2016-10-09 DIAGNOSIS — Z823 Family history of stroke: Secondary | ICD-10-CM

## 2016-10-09 DIAGNOSIS — K219 Gastro-esophageal reflux disease without esophagitis: Secondary | ICD-10-CM | POA: Diagnosis present

## 2016-10-09 DIAGNOSIS — M25559 Pain in unspecified hip: Secondary | ICD-10-CM | POA: Diagnosis present

## 2016-10-09 DIAGNOSIS — Z885 Allergy status to narcotic agent status: Secondary | ICD-10-CM

## 2016-10-09 DIAGNOSIS — Z9842 Cataract extraction status, left eye: Secondary | ICD-10-CM

## 2016-10-09 DIAGNOSIS — G8191 Hemiplegia, unspecified affecting right dominant side: Secondary | ICD-10-CM

## 2016-10-09 DIAGNOSIS — E785 Hyperlipidemia, unspecified: Secondary | ICD-10-CM | POA: Diagnosis present

## 2016-10-09 DIAGNOSIS — Z8249 Family history of ischemic heart disease and other diseases of the circulatory system: Secondary | ICD-10-CM

## 2016-10-09 DIAGNOSIS — F411 Generalized anxiety disorder: Secondary | ICD-10-CM | POA: Diagnosis present

## 2016-10-09 DIAGNOSIS — Z886 Allergy status to analgesic agent status: Secondary | ICD-10-CM

## 2016-10-09 DIAGNOSIS — Z809 Family history of malignant neoplasm, unspecified: Secondary | ICD-10-CM

## 2016-10-09 DIAGNOSIS — R531 Weakness: Secondary | ICD-10-CM

## 2016-10-09 DIAGNOSIS — Z833 Family history of diabetes mellitus: Secondary | ICD-10-CM

## 2016-10-09 DIAGNOSIS — Z821 Family history of blindness and visual loss: Secondary | ICD-10-CM

## 2016-10-09 LAB — COMPREHENSIVE METABOLIC PANEL
ALBUMIN: 3.9 g/dL (ref 3.5–5.0)
ALK PHOS: 84 U/L (ref 38–126)
ALT: 14 U/L (ref 14–54)
ANION GAP: 9 (ref 5–15)
AST: 20 U/L (ref 15–41)
BILIRUBIN TOTAL: 0.5 mg/dL (ref 0.3–1.2)
BUN: 10 mg/dL (ref 6–20)
CALCIUM: 9.1 mg/dL (ref 8.9–10.3)
CO2: 23 mmol/L (ref 22–32)
CREATININE: 0.74 mg/dL (ref 0.44–1.00)
Chloride: 104 mmol/L (ref 101–111)
GFR calc Af Amer: >60 mL/min (ref >60)
GFR calc non Af Amer: >60 mL/min (ref >60)
GLUCOSE: 118 mg/dL — AB (ref 65–99)
Potassium: 3.2 mmol/L — ABNORMAL LOW (ref 3.5–5.1)
SODIUM: 136 mmol/L (ref 135–145)
TOTAL PROTEIN: 7.7 g/dL (ref 6.5–8.1)

## 2016-10-09 LAB — I-STAT CHEM 8, ED
BUN: 11 mg/dL (ref 6–20)
CALCIUM ION: 1.12 mmol/L — AB (ref 1.15–1.40)
CHLORIDE: 104 mmol/L (ref 101–111)
CREATININE: 0.7 mg/dL (ref 0.44–1.00)
Glucose, Bld: 117 mg/dL — ABNORMAL HIGH (ref 65–99)
HCT: 43 % (ref 36.0–46.0)
Hemoglobin: 14.6 g/dL (ref 12.0–15.0)
Potassium: 4 mmol/L (ref 3.5–5.1)
Sodium: 141 mmol/L (ref 135–145)
TCO2: 25 mmol/L (ref 0–100)

## 2016-10-09 LAB — I-STAT TROPONIN, ED: Troponin i, poc: 0.01 ng/mL (ref 0.00–0.08)

## 2016-10-09 LAB — DIFFERENTIAL
Basophils Absolute: 0 10*3/uL (ref 0.0–0.1)
Basophils Relative: 1 %
EOS PCT: 2 %
Eosinophils Absolute: 0.2 10*3/uL (ref 0.0–0.7)
LYMPHS ABS: 2.7 10*3/uL (ref 0.7–4.0)
LYMPHS PCT: 31 %
MONO ABS: 0.7 10*3/uL (ref 0.1–1.0)
Monocytes Relative: 8 %
NEUTROS ABS: 5.2 10*3/uL (ref 1.7–7.7)
NEUTROS PCT: 58 %

## 2016-10-09 LAB — PROTIME-INR
INR: 0.99
Prothrombin Time: 13 seconds (ref 11.4–15.2)

## 2016-10-09 LAB — ETHANOL: Alcohol, Ethyl (B): <5 mg/dL (ref <5)

## 2016-10-09 LAB — CBC
HCT: 40.4 % (ref 36.0–46.0)
HEMOGLOBIN: 13.6 g/dL (ref 12.0–15.0)
MCH: 30.6 pg (ref 26.0–34.0)
MCHC: 33.7 g/dL (ref 30.0–36.0)
MCV: 91 fL (ref 78.0–100.0)
PLATELETS: 267 10*3/uL (ref 150–400)
RBC: 4.44 MIL/uL (ref 3.87–5.11)
RDW: 13.4 % (ref 11.5–15.5)
WBC: 8.8 10*3/uL (ref 4.0–10.5)

## 2016-10-09 LAB — APTT: aPTT: 29 seconds (ref 24–36)

## 2016-10-09 MED ORDER — ATORVASTATIN CALCIUM 40 MG PO TABS
40.0000 mg | ORAL_TABLET | Freq: Every day | ORAL | Status: DC
  Administered 2016-10-10: 40 mg via ORAL

## 2016-10-09 NOTE — ED Notes (Signed)
Patient transported to MRI 

## 2016-10-09 NOTE — ED Provider Notes (Signed)
[facility]Y DEPT Provider Note   CSN: [id number] Arrival date & time: 10/09/16  1731     History   Chief Complaint No chief complaint on file.   HPI Krista Munoz is a 82 y.o. female.  Patient referred in by Dr. Juanetta Gosling. He had ordered a head CT based on complaint of right-sided weakness that started 2 days ago. Head CT showed evidence of an internal capsule acute or subacute stroke. Patient has persistent weakness on the right side no speech problems. No headache. Symptoms have not gotten worse. No prior history of stroke.      Past Medical History:  Diagnosis Date  . Allergy   . Anxiety   . Cataract   . Cystocele 09/05/2013  . GERD (gastroesophageal reflux disease)   . Glaucoma   . Hemorrhoids 09/05/2013  . Hypertension   . Rectocele 09/05/2013    Patient Active Problem List   Diagnosis Date Noted  . Acute CVA (cerebrovascular accident) (HCC) 10/09/2016  . HTN (hypertension) 10/09/2016  . Rectocele 09/05/2013  . Cystocele 09/05/2013  . Hemorrhoids 09/05/2013  . TOTAL HIP FOLLOW-UP 03/06/2009  . Osteoarthrosis, unspecified whether generalized or localized, pelvic region and thigh 01/10/2009  . HIP PAIN 01/10/2009    Past Surgical History:  Procedure Laterality Date  . EYE SURGERY Left    tube placed and cataract removed  . HIP SURGERY Left 2010    hip replacement.    OB History    Gravida Para Term Preterm AB Living   5 5       5    SAB TAB Ectopic Multiple Live Births           5       Home Medications    Prior to Admission medications   Medication Sig Start Date End Date Taking? Authorizing Provider  acetaminophen (TYLENOL) 325 MG tablet Take 325 mg by mouth every 6 (six) hours as needed for pain.    Historical Provider, MD  ALPRAZolam Prudy Feeler) 0.5 MG tablet Take 0.5 mg by mouth 3 (three) times daily. 09/03/16   Historical Provider, MD  bimatoprost (LUMIGAN) 0.03 % ophthalmic solution Place 1 drop into both eyes at bedtime.    Historical  Provider, MD  brimonidine (ALPHAGAN P) 0.1 % SOLN Place 1 drop into both eyes 3 (three) times daily.    Historical Provider, MD  dorzolamide-timolol (COSOPT) 22.3-6.8 MG/ML ophthalmic solution Place 1 drop into both eyes 2 (two) times daily.    Historical Provider, MD  lansoprazole (PREVACID) 15 MG capsule Take 15 mg by mouth daily.    Historical Provider, MD  loratadine (CLARITIN) 10 MG tablet Take 10 mg by mouth daily.    Historical Provider, MD  meclizine (ANTIVERT) 25 MG tablet Take 25 mg by mouth 3 (three) times daily as needed. 10/09/16   Historical Provider, MD  Menthol, Topical Analgesic, 4 % GEL Apply to [facility] JOINT THREE TIMES DAILY 11/01/13   Vickki Hearing, MD  Multiple Vitamins-Minerals (CENTRUM SILVER PO) Take 1 tablet by mouth daily.    Historical Provider, MD  olmesartan (BENICAR) 20 MG tablet Take 20 mg by mouth daily.    Historical Provider, MD  valsartan (DIOVAN) 160 MG tablet Take 160 mg by mouth daily. 09/19/16   Historical Provider, MD    Family History Family History  Problem Relation Age of Onset  . Cancer Mother   . Heart disease Father   . Heart attack Father   . Cancer Sister 62  breast   . Arthritis Daughter   . Asthma Daughter   . Diabetes Daughter   . Diabetes Paternal Grandmother   . Cancer Sister     colon   . Cancer Sister   . Stroke Sister   . Arthritis Sister   . Heart disease Sister   . Kidney disease Sister   . Cancer Sister     breast and lung   . Diabetes Sister   . Vision loss Brother   . Diabetes Brother   . Arthritis Son   . Alcohol abuse Son   . Alcohol abuse Son   . Arthritis Son   . Depression Son   . Mental illness Son     Social History Social History  Substance Use Topics  . Smoking status: Passive Smoke Exposure - Never Smoker  . Smokeless tobacco: Never Used  . Alcohol use No     Allergies   Morphine and related and Codeine   Review of Systems Review of Systems  Constitutional: Negative for fever.  HENT:  Negative for congestion.   Eyes: Negative for visual disturbance.  Respiratory: Negative for shortness of breath.   Cardiovascular: Negative for chest pain.  Gastrointestinal: Negative for abdominal pain.  Genitourinary: Negative for dysuria.  Musculoskeletal: Negative for back pain and neck pain.  Skin: Negative for rash.  Neurological: Positive for weakness. Negative for syncope, speech difficulty and headaches.  Hematological: Does not bruise/bleed easily.  Psychiatric/Behavioral: Negative for confusion.     Physical Exam Updated Vital Signs BP 149/65   Pulse 78   Temp 98.6 F (37 C) (Oral)   Resp 17   Ht 5\' 2"  (1.575 m)   Wt 68 kg   SpO2 92%   BMI 27.44 kg/m   Physical Exam  Constitutional: She is oriented to person, place, and time. She appears well-developed and well-nourished. No distress.  HENT:  Head: Normocephalic and atraumatic.  Mouth/Throat: Oropharynx is clear and moist.  Eyes: Conjunctivae and EOM are normal. Pupils are equal, round, and reactive to light.  Left eye with a pterygium.  Neck: Normal range of motion. Neck supple.  Cardiovascular: Normal rate, regular rhythm and normal heart sounds.   Pulmonary/Chest: Effort normal and breath sounds normal.  Abdominal: Soft. Bowel sounds are normal. There is no tenderness.  Musculoskeletal: Normal range of motion. She exhibits no edema.  Neurological: She is alert and oriented to person, place, and time. No cranial nerve deficit.  Mild weakness noted to the right side upper extremity lower extremity.  Skin: Skin is warm.  Nursing note and vitals reviewed.    ED Treatments / Results  Labs (all labs ordered are listed, but only abnormal results are displayed) Labs Reviewed  COMPREHENSIVE METABOLIC PANEL - Abnormal; Notable for the following:       Result Value   Potassium 3.2 (*)    Glucose, Bld 118 (*)    All other components within normal limits  I-STAT CHEM 8, ED - Abnormal; Notable for the  following:    Glucose, Bld 117 (*)    Calcium, Ion 1.12 (*)    All other components within normal limits  ETHANOL  PROTIME-INR  APTT  CBC  DIFFERENTIAL  RAPID URINE DRUG SCREEN, HOSP PERFORMED  URINALYSIS, ROUTINE W REFLEX MICROSCOPIC (NOT AT [facility])  LIPID PANEL  I-STAT TROPOININ, ED    EKG  EKG Interpretation None      ED ECG REPORT   Date: 10/09/2016  Rate: 75  Rhythm: normal sinus  rhythm  QRS Axis: normal  Intervals: normal  ST/T Wave abnormalities: nonspecific ST/T changes  Conduction Disutrbances:none  Narrative Interpretation:   Old EKG Reviewed: none available  I have personally reviewed the EKG tracing and agree with the computerized printout as noted.  Radiology Ct Head Wo Contrast  Result Date: 10/09/2016 CLINICAL DATA:  Initial evaluation for possible stroke. Slurred speech with right-sided weakness. EXAM: CT HEAD WITHOUT CONTRAST TECHNIQUE: Contiguous axial images were obtained from the base of the skull through the vertex without intravenous contrast. COMPARISON:  Comparison made with prior head CT from 10/26/2013. FINDINGS: Brain: Mild diffuse prominence of the CSF containing spaces is compatible with generalized cerebral atrophy. Patchy and confluent hypodensity within the periventricular and deep white matter both cerebral hemispheres most consistent with chronic microvascular ischemic changes. These changes have mildly progressed relative to 2014. There is a somewhat age indeterminate hypodensity within the posterior limb of the left internal capsule measuring 11 mm (series 5, image 31). Finding is somewhat age indeterminate, but could be acute/subacute in nature. No associated hemorrhage or mass effect. No evidence for acute intracranial hemorrhage. Gray-white matter differentiation otherwise maintained without evidence for acute large vessel territory infarct. Deep gray nuclei maintained. No mass lesion, midline shift or mass effect. No hydrocephalus. No  extra-axial fluid collection. Vascular: No hyperdense vessel identified. Scattered vascular calcifications present within the carotid siphons. Skull: Scalp soft tissues demonstrate no acute abnormality. Calvarium intact. Sinuses/Orbits: Visualized globes and orbits within normal limits. Visualized paranasal sinuses are clear. No mastoid effusion. IMPRESSION: 1. 11 mm hypodensity within the posterior limb of the left internal capsule, somewhat age indeterminate, but could reflect an acute/subacute ischemic infarct. This could be further assessed with dedicated MRI as clinically indicated. No associated hemorrhage or mass effect. 2. No other acute intracranial process identified. 3. Mild age-related cerebral atrophy with chronic small vessel ischemic disease, progressed relative to most recent CT from 2014. Results were called by telephone at the time of interpretation on 10/09/2016 at 5:21 pm to Dr. Kari Baars , who verbally acknowledged these results. Electronically Signed   By: Rise Mu M.D.   On: 10/09/2016 17:26   Mr Brain Wo Contrast (neuro Protocol)  Result Date: 10/09/2016 CLINICAL DATA:  Initial evaluation for acute stroke. EXAM: MRI HEAD WITHOUT CONTRAST TECHNIQUE: Multiplanar, multiecho pulse sequences of the brain and surrounding structures were obtained without intravenous contrast. COMPARISON:  Prior CT from earlier the same day. FINDINGS: Brain: Study is limited as the patient was unable to tolerate the full length of the exam. Additionally, the images provided are degraded by motion artifact. Diffusion-weighted sequences demonstrate abnormal restricted diffusion involving the posterior limb of the left internal capsule/left thalamus, compatible with acute ischemic infarct. This confirms the finding on prior CT. Infarct measures 13 x 10 x 13 mm. No associated mass effect. No findings to suggest hemorrhage. No other evidence for acute ischemia. No definite mass lesion. No midline shift  or mass effect. Ventricles normal in size without hydrocephalus. No extra-axial fluid collection. Pituitary gland and suprasellar region normal. Midline structures intact. Vascular: Major intracranial vascular flow voids grossly maintained. Skull and upper cervical spine: Craniocervical junction normal. Visualized upper cervical spine unremarkable without significant degenerative changes are stenosis. Bone marrow signal intensity within normal limits. No scalp soft tissue abnormality. Sinuses/Orbits: Globes and orbital soft tissues grossly normal. Patient appears to be status post lens extraction bilaterally. Paranasal sinuses are clear. No mastoid effusion. IMPRESSION: 1. 13 x 10 x 13 mm acute ischemic lacunar  infarct involving the posterior limb of the left internal capsule. The previously questioned abnormality on prior CT is confirmed. 2. No other definite acute intracranial process on this limited motion degraded study. Electronically Signed   By: Rise MuBenjamin  McClintock M.D.   On: 10/09/2016 20:31    Procedures Procedures (including critical care time)  Medications Ordered in ED Medications  atorvastatin (LIPITOR) tablet 40 mg (not administered)     Initial Impression / Assessment and Plan / ED Course  I have reviewed the triage vital signs and the nursing notes.  Pertinent labs & imaging results that were available during my care of the patient were reviewed by me and considered in my medical decision making (see chart for details).  Clinical Course     Referred in by Dr. Juanetta GoslingHawkins first head CT that was consistent with the acute or subacute CVA. MRI confirmed an acute left internal capsule stroke. Correlates with her symptoms on the right side which is right-sided weakness. Symptoms occurred 2 days ago. Patient will be admitted and rest of workup will be completed. Patient remained stable here.  Stroke order protocol ordered. Patient is not a candidate for TPA or any acute intervention since  symptoms occurred 2 days ago.  Final Clinical Impressions(s) / ED Diagnoses   Final diagnoses:  Cerebrovascular accident (CVA), unspecified mechanism (HCC)    New Prescriptions New Prescriptions   No medications on file     Vanetta MuldersScott Ekam Bonebrake, MD 10/10/16 [phone]

## 2016-10-09 NOTE — ED Notes (Signed)
Patient on cardiac monitoring at this time. 

## 2016-10-09 NOTE — H&P (Signed)
History and Physical    Krista Munoz ZOX:[id number] DOB: January 01, 1931 DOA: 10/09/2016  PCP: Fredirick MaudlinHAWKINS,EDWARD L, MD  Patient coming from: Home   Chief Complaint: Right-sided weakness   HPI: Krista FlemingsBetty K Barich is a 82 y.o. female with a past medical history significant for GERD, and HTN, presented with complaints of right-sided weakness with ictus 2 days ago.  She denies any visual problems, headaches or slurred speech Patient had a head CT as an outpatient which was consistent for an acute CVA, she was referred to the hospital. While in the ED, Head CT and  MRI were positive for an acute CVA and she has been started on a stroke work-up. Hospitalist was asked to admit the patient for further evaluation of a left cerebrovascular accident. She has not been on aspirin or statin PTA.   ED Course: Serology unremarkable except for potassium 3.2. MRI and head CT shows a left-sided ischemic lacunar infarct.   Review of Systems: As per HPI otherwise 10 point review of systems negative.    Past Medical History:  Diagnosis Date  . Allergy   . Anxiety   . Cataract   . Cystocele 09/05/2013  . GERD (gastroesophageal reflux disease)   . Glaucoma   . Hemorrhoids 09/05/2013  . Hypertension   . Rectocele 09/05/2013    Past Surgical History:  Procedure Laterality Date  . EYE SURGERY Left    tube placed and cataract removed  . HIP SURGERY Left 2010    hip replacement.     reports that she is a non-smoker but has been exposed to tobacco smoke. She has never used smokeless tobacco. She reports that she does not drink alcohol or use drugs.  Allergies  Allergen Reactions  . Morphine And Related Other (See Comments)    Altered mental status  . Codeine Palpitations    Family History  Problem Relation Age of Onset  . Cancer Mother   . Heart disease Father   . Heart attack Father   . Cancer Sister [id number]    breast   . Arthritis Daughter   . Asthma Daughter   . Diabetes Daughter   . Diabetes Paternal  Grandmother   . Cancer Sister     colon   . Cancer Sister   . Stroke Sister   . Arthritis Sister   . Heart disease Sister   . Kidney disease Sister   . Cancer Sister     breast and lung   . Diabetes Sister   . Vision loss Brother   . Diabetes Brother   . Arthritis Son   . Alcohol abuse Son   . Alcohol abuse Son   . Arthritis Son   . Depression Son   . Mental illness Son     Prior to Admission medications   Medication Sig Start Date End Date Taking? Authorizing Provider  acetaminophen (TYLENOL) 325 MG tablet Take 325 mg by mouth every 6 (six) hours as needed for pain.    Historical Provider, MD  ALPRAZolam Prudy Feeler(XANAX) 0.5 MG tablet Take 0.5 mg by mouth 3 (three) times daily. 09/03/16   Historical Provider, MD  bimatoprost (LUMIGAN) 0.03 % ophthalmic solution Place 1 drop into both eyes at bedtime.    Historical Provider, MD  brimonidine (ALPHAGAN P) 0.1 % SOLN Place 1 drop into both eyes 3 (three) times daily.    Historical Provider, MD  dorzolamide-timolol (COSOPT) 22.3-6.8 MG/ML ophthalmic solution Place 1 drop into both eyes 2 (two) times daily.  Historical Provider, MD  lansoprazole (PREVACID) 15 MG capsule Take 15 mg by mouth daily.    Historical Provider, MD  loratadine (CLARITIN) 10 MG tablet Take 10 mg by mouth daily.    Historical Provider, MD  meclizine (ANTIVERT) 25 MG tablet Take 25 mg by mouth 3 (three) times daily as needed. 10/09/16   Historical Provider, MD  Menthol, Topical Analgesic, 4 % GEL Apply to [facility] JOINT THREE TIMES DAILY 11/01/13   Vickki Hearing, MD  Multiple Vitamins-Minerals (CENTRUM SILVER PO) Take 1 tablet by mouth daily.    Historical Provider, MD  olmesartan (BENICAR) 20 MG tablet Take 20 mg by mouth daily.    Historical Provider, MD  valsartan (DIOVAN) 160 MG tablet Take 160 mg by mouth daily. 09/19/16   Historical Provider, MD    Physical Exam: Vitals:   10/09/16 1900 10/09/16 1930 10/09/16 2115 10/09/16 2130  BP: 133/73 154/69 162/89 156/77    Pulse: 73 75 80 88  Resp:      Temp:      TempSrc:      SpO2: 95% 98% 96% 91%  Weight:      Height:          Constitutional: NAD, calm, comfortable Vitals:   10/09/16 1900 10/09/16 1930 10/09/16 2115 10/09/16 2130  BP: 133/73 154/69 162/89 156/77  Pulse: 73 75 80 88  Resp:      Temp:      TempSrc:      SpO2: 95% 98% 96% 91%  Weight:      Height:       Eyes: PERRL, lids and conjunctivae normal ENMT: Mucous membranes are moist. Posterior pharynx clear of any exudate or lesions.Normal dentition.  Neck: normal, supple, no masses, no thyromegaly Respiratory: clear to auscultation bilaterally, no wheezing, no crackles. Normal respiratory effort. No accessory muscle use.  Cardiovascular: Regular rate and rhythm, no murmurs / rubs / gallops. No extremity edema. 2+ pedal pulses. No carotid bruits.  Abdomen: no tenderness, no masses palpated. No hepatosplenomegaly. Bowel sounds positive.  Musculoskeletal: no clubbing / cyanosis. No joint deformity upper and lower extremities. Good ROM, no contractures. Normal muscle tone.  Skin: no rashes, lesions, ulcers. No induration Neurologic: CN 2-12 grossly intact. Sensation intact, DTR normal. Slightly weaker on the right side UE and Aidee Latimore.  Speech is fluent.  Psychiatric: Normal judgment and insight. Alert and oriented x 3. Normal mood.    Labs on Admission: I have personally reviewed following labs and imaging studies   Recent Labs Lab 10/09/16 1930 10/09/16 1943  WBC 8.8  --   NEUTROABS 5.2  --   HGB 13.6 14.6  HCT 40.4 43.0  MCV 91.0  --   PLT 267  --     Recent Labs Lab 10/09/16 1930 10/09/16 1943  NA 136 141  K 3.2* 4.0  CL 104 104  CO2 23  --   GLUCOSE 118* 117*  BUN 10 11  CREATININE 0.74 0.70  CALCIUM 9.1  --     Recent Labs Lab 10/09/16 1930  AST 20  ALT 14  ALKPHOS 84  BILITOT 0.5  PROT 7.7  ALBUMIN 3.9    Recent Labs Lab 10/09/16 1930  INR 0.99    Radiological Exams on Admission: Ct Head Wo  Contrast  Result Date: 10/09/2016 CLINICAL DATA:  Initial evaluation for possible stroke. Slurred speech with right-sided weakness. EXAM: CT HEAD WITHOUT CONTRAST TECHNIQUE: Contiguous axial images were obtained from the base of the skull through the  vertex without intravenous contrast. COMPARISON:  Comparison made with prior head CT from 10/26/2013. FINDINGS: Brain: Mild diffuse prominence of the CSF containing spaces is compatible with generalized cerebral atrophy. Patchy and confluent hypodensity within the periventricular and deep white matter both cerebral hemispheres most consistent with chronic microvascular ischemic changes. These changes have mildly progressed relative to 2014. There is a somewhat age indeterminate hypodensity within the posterior limb of the left internal capsule measuring 11 mm (series 5, image 31). Finding is somewhat age indeterminate, but could be acute/subacute in nature. No associated hemorrhage or mass effect. No evidence for acute intracranial hemorrhage. Gray-white matter differentiation otherwise maintained without evidence for acute large vessel territory infarct. Deep gray nuclei maintained. No mass lesion, midline shift or mass effect. No hydrocephalus. No extra-axial fluid collection. Vascular: No hyperdense vessel identified. Scattered vascular calcifications present within the carotid siphons. Skull: Scalp soft tissues demonstrate no acute abnormality. Calvarium intact. Sinuses/Orbits: Visualized globes and orbits within normal limits. Visualized paranasal sinuses are clear. No mastoid effusion. IMPRESSION: 1. 11 mm hypodensity within the posterior limb of the left internal capsule, somewhat age indeterminate, but could reflect an acute/subacute ischemic infarct. This could be further assessed with dedicated MRI as clinically indicated. No associated hemorrhage or mass effect. 2. No other acute intracranial process identified. 3. Mild age-related cerebral atrophy with  chronic small vessel ischemic disease, progressed relative to most recent CT from 2014. Results were called by telephone at the time of interpretation on 10/09/2016 at 5:21 pm to Dr. Kari Baars , who verbally acknowledged these results. Electronically Signed   By: Rise Mu M.D.   On: 10/09/2016 17:26   Mr Brain Wo Contrast (neuro Protocol)  Result Date: 10/09/2016 CLINICAL DATA:  Initial evaluation for acute stroke. EXAM: MRI HEAD WITHOUT CONTRAST TECHNIQUE: Multiplanar, multiecho pulse sequences of the brain and surrounding structures were obtained without intravenous contrast. COMPARISON:  Prior CT from earlier the same day. FINDINGS: Brain: Study is limited as the patient was unable to tolerate the full length of the exam. Additionally, the images provided are degraded by motion artifact. Diffusion-weighted sequences demonstrate abnormal restricted diffusion involving the posterior limb of the left internal capsule/left thalamus, compatible with acute ischemic infarct. This confirms the finding on prior CT. Infarct measures 13 x 10 x 13 mm. No associated mass effect. No findings to suggest hemorrhage. No other evidence for acute ischemia. No definite mass lesion. No midline shift or mass effect. Ventricles normal in size without hydrocephalus. No extra-axial fluid collection. Pituitary gland and suprasellar region normal. Midline structures intact. Vascular: Major intracranial vascular flow voids grossly maintained. Skull and upper cervical spine: Craniocervical junction normal. Visualized upper cervical spine unremarkable without significant degenerative changes are stenosis. Bone marrow signal intensity within normal limits. No scalp soft tissue abnormality. Sinuses/Orbits: Globes and orbital soft tissues grossly normal. Patient appears to be status post lens extraction bilaterally. Paranasal sinuses are clear. No mastoid effusion. IMPRESSION: 1. 13 x 10 x 13 mm acute ischemic lacunar infarct  involving the posterior limb of the left internal capsule. The previously questioned abnormality on prior CT is confirmed. 2. No other definite acute intracranial process on this limited motion degraded study. Electronically Signed   By: Rise Mu M.D.   On: 10/09/2016 20:31    EKG: Independently reviewed. Sinus rhythm   Assessment/Plan  1. 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 She is not a candidate for TPA due to 2-day  onset. Will continue with stroke workup including carotid droppers and echocardiogram.  Will start her on ASA as soon as bedside swallowing is OK.  Allow permissive HTN.  PT/OT evaluation.  2. HTN. Will hold antihypertensives Diovan and Benicar for now.  3. Vertigo. Continue meclizine and Antivert.  4. Glaucoma. Continue eye drops.    DVT prophylaxis: SQ heparin  Code Status: FULL  Family Communication: Daughter and grand-daughters bedside  Disposition Plan: Discharge home once improved.  Consults called: None.  Admission status: Inpatient    Houston SirenPeter Akiera Allbaugh, MD FACP [facility] If 7PM-7AM, please contact night-coverage [email or web address] Password [email or web address]  10/09/2016, 9:53 PM    By signing my name below, I, Cynda AcresHailei Fulton, attest that this documentation has been prepared under the direction and in the presence of Houston SirenPeter Liberty Stead, MD. Electronically signed: Cynda AcresHailei Fulton, Scribe. 10/09/16 10:20 PM

## 2016-10-09 NOTE — ED Triage Notes (Signed)
Grand daughter reports pt had a ct head today and was told it looked like she may have had a small stroke and wanted her admitted.  Reports R sided weakness since Tuesday and dizziness.

## 2016-10-10 ENCOUNTER — Other Ambulatory Visit (HOSPITAL_COMMUNITY): Payer: Commercial Managed Care - HMO

## 2016-10-10 ENCOUNTER — Observation Stay (HOSPITAL_COMMUNITY): Payer: Commercial Managed Care - HMO

## 2016-10-10 ENCOUNTER — Encounter (HOSPITAL_COMMUNITY): Payer: Self-pay | Admitting: *Deleted

## 2016-10-10 DIAGNOSIS — Z841 Family history of disorders of kidney and ureter: Secondary | ICD-10-CM | POA: Diagnosis not present

## 2016-10-10 DIAGNOSIS — Z809 Family history of malignant neoplasm, unspecified: Secondary | ICD-10-CM | POA: Diagnosis not present

## 2016-10-10 DIAGNOSIS — Z9842 Cataract extraction status, left eye: Secondary | ICD-10-CM | POA: Diagnosis not present

## 2016-10-10 DIAGNOSIS — Z823 Family history of stroke: Secondary | ICD-10-CM | POA: Diagnosis not present

## 2016-10-10 DIAGNOSIS — K219 Gastro-esophageal reflux disease without esophagitis: Secondary | ICD-10-CM | POA: Diagnosis present

## 2016-10-10 DIAGNOSIS — Z818 Family history of other mental and behavioral disorders: Secondary | ICD-10-CM | POA: Diagnosis not present

## 2016-10-10 DIAGNOSIS — Z886 Allergy status to analgesic agent status: Secondary | ICD-10-CM | POA: Diagnosis not present

## 2016-10-10 DIAGNOSIS — H409 Unspecified glaucoma: Secondary | ICD-10-CM | POA: Diagnosis present

## 2016-10-10 DIAGNOSIS — G8191 Hemiplegia, unspecified affecting right dominant side: Secondary | ICD-10-CM | POA: Diagnosis present

## 2016-10-10 DIAGNOSIS — Z96649 Presence of unspecified artificial hip joint: Secondary | ICD-10-CM | POA: Diagnosis present

## 2016-10-10 DIAGNOSIS — I639 Cerebral infarction, unspecified: Secondary | ICD-10-CM | POA: Diagnosis present

## 2016-10-10 DIAGNOSIS — Z821 Family history of blindness and visual loss: Secondary | ICD-10-CM | POA: Diagnosis not present

## 2016-10-10 DIAGNOSIS — Z885 Allergy status to narcotic agent status: Secondary | ICD-10-CM | POA: Diagnosis not present

## 2016-10-10 DIAGNOSIS — Z8261 Family history of arthritis: Secondary | ICD-10-CM | POA: Diagnosis not present

## 2016-10-10 DIAGNOSIS — Z833 Family history of diabetes mellitus: Secondary | ICD-10-CM | POA: Diagnosis not present

## 2016-10-10 DIAGNOSIS — E785 Hyperlipidemia, unspecified: Secondary | ICD-10-CM | POA: Diagnosis present

## 2016-10-10 DIAGNOSIS — Z8249 Family history of ischemic heart disease and other diseases of the circulatory system: Secondary | ICD-10-CM | POA: Diagnosis not present

## 2016-10-10 DIAGNOSIS — F411 Generalized anxiety disorder: Secondary | ICD-10-CM | POA: Diagnosis present

## 2016-10-10 DIAGNOSIS — I10 Essential (primary) hypertension: Secondary | ICD-10-CM | POA: Diagnosis present

## 2016-10-10 DIAGNOSIS — Z79899 Other long term (current) drug therapy: Secondary | ICD-10-CM | POA: Diagnosis not present

## 2016-10-10 DIAGNOSIS — Z825 Family history of asthma and other chronic lower respiratory diseases: Secondary | ICD-10-CM | POA: Diagnosis not present

## 2016-10-10 LAB — LIPID PANEL
Cholesterol: 178 mg/dL (ref 0–200)
HDL: 51 mg/dL (ref >40)
LDL CALC: 114 mg/dL — AB (ref 0–99)
TRIGLYCERIDES: 64 mg/dL (ref <150)
Total CHOL/HDL Ratio: 3.5 RATIO
VLDL: 13 mg/dL (ref 0–40)

## 2016-10-10 LAB — URINALYSIS, ROUTINE W REFLEX MICROSCOPIC
Glucose, UA: NEGATIVE mg/dL
Hgb urine dipstick: NEGATIVE
KETONES UR: 40 mg/dL — AB
NITRITE: NEGATIVE
PH: 5.5 (ref 5.0–8.0)
Specific Gravity, Urine: >1.03 — ABNORMAL HIGH (ref 1.005–1.030)

## 2016-10-10 LAB — URINE MICROSCOPIC-ADD ON: RBC / HPF: NONE SEEN RBC/hpf (ref 0–5)

## 2016-10-10 MED ORDER — ASPIRIN 325 MG PO TABS
325.0000 mg | ORAL_TABLET | Freq: Every day | ORAL | Status: DC
  Administered 2016-10-10 – 2016-10-11 (×2): 325 mg via ORAL
  Filled 2016-10-10 (×2): qty 1

## 2016-10-10 MED ORDER — HEPARIN SODIUM (PORCINE) 5000 UNIT/ML IJ SOLN
5000.0000 [IU] | Freq: Three times a day (TID) | INTRAMUSCULAR | Status: DC
  Administered 2016-10-10 – 2016-10-11 (×5): 5000 [IU] via SUBCUTANEOUS
  Filled 2016-10-10 (×5): qty 1

## 2016-10-10 MED ORDER — ALPRAZOLAM 0.5 MG PO TABS
0.5000 mg | ORAL_TABLET | Freq: Three times a day (TID) | ORAL | Status: DC
  Administered 2016-10-10 – 2016-10-11 (×3): 0.5 mg via ORAL
  Filled 2016-10-10 (×3): qty 1

## 2016-10-10 MED ORDER — ASPIRIN 300 MG RE SUPP: 300.0000 mg | Freq: Every day | RECTAL | Status: DC

## 2016-10-10 MED ORDER — LATANOPROST 0.005 % OP SOLN
1.0000 [drp] | Freq: Every day | OPHTHALMIC | Status: DC
  Administered 2016-10-10: 1 [drp] via OPHTHALMIC
  Filled 2016-10-10: qty 2.5

## 2016-10-10 MED ORDER — STROKE: EARLY STAGES OF RECOVERY BOOK
Freq: Once | Status: AC
  Administered 2016-10-10: 17:00:00
  Filled 2016-10-10 (×2): qty 1

## 2016-10-10 MED ORDER — ACETAMINOPHEN 325 MG PO TABS: 325.0000 mg | ORAL_TABLET | ORAL | Status: DC | PRN

## 2016-10-10 MED ORDER — DORZOLAMIDE HCL-TIMOLOL MAL 2-0.5 % OP SOLN
1.0000 [drp] | Freq: Two times a day (BID) | OPHTHALMIC | Status: DC
  Administered 2016-10-10 – 2016-10-11 (×3): 1 [drp] via OPHTHALMIC
  Filled 2016-10-10: qty 10

## 2016-10-10 MED ORDER — SENNOSIDES-DOCUSATE SODIUM 8.6-50 MG PO TABS: 1.0000 | ORAL_TABLET | Freq: Every evening | ORAL | Status: DC | PRN

## 2016-10-10 MED ORDER — BRIMONIDINE TARTRATE 0.15 % OP SOLN
1.0000 [drp] | Freq: Three times a day (TID) | OPHTHALMIC | Status: DC
  Administered 2016-10-10 – 2016-10-11 (×4): 1 [drp] via OPHTHALMIC
  Filled 2016-10-10: qty 5

## 2016-10-10 MED ORDER — PANTOPRAZOLE SODIUM 40 MG PO TBEC
40.0000 mg | DELAYED_RELEASE_TABLET | Freq: Every day | ORAL | Status: DC
  Administered 2016-10-10 – 2016-10-11 (×2): 40 mg via ORAL
  Filled 2016-10-10 (×2): qty 1

## 2016-10-10 MED ORDER — SODIUM CHLORIDE 0.9 % IV SOLN
INTRAVENOUS | Status: DC
  Administered 2016-10-10: 02:00:00 via INTRAVENOUS

## 2016-10-10 NOTE — H&P (Signed)
[place] A. Merlene Laughter, MD     [email or web address]          Krista Munoz is an 82 y.o. female.   ASSESSMENT/PLAN:  Left thalamic capsular lacunar infarct: Risk factors includes hypertension and age. The current workup is almost complete. Echo is pending. I agree with the aspirin and the use of statin medication. Physical and occupational therapy has also seen the patient.  This 82 year old white female who presents with a 2 day history of weakness involving the right upper extremity and right leg. The symptoms are associated with numbness in the same distribution. The patient denies dysarthria. There are no reports of headaches, dizziness or diplopia. No chest pain or shortness of breath is reported. Patient's symptoms are not deteriorated but has remained stable. The review of systems otherwise negative.   GENERAL: Pleasant female who is sleeping but easily arousable.  HEENT: Supple. Atraumatic normocephalic.   ABDOMEN: soft  EXTREMITIES: No edema   BACK: Normal.  SKIN: Normal by inspection.    MENTAL STATUS: Alert and oriented when awaken. She states her age and the month appropriately. Speech, language and cognition are generally intact. Judgment and insight normal.   CRANIAL NERVES: Pupils are equal, round and reactive to light and accommodation; extra ocular movements are full, there is no significant nystagmus; visual fields are full; upper and lower facial muscles are normal in strength and symmetric, there is no flattening of the nasolabial folds; tongue is midline; uvula is midline; shoulder elevation is normal.  MOTOR: Normal tone, bulk and strength - L; there is a significant drift of the right upper extremity. No drift of the legs. The right leg is graded as 4+/5.  COORDINATION: Left finger to nose is normal, No rest tremor; no intention tremor; no postural tremor; no bradykinesia. There is mild dysmetria of the right side particularly the right  upper extremity.  REFLEXES: Deep tendon reflexes are symmetrical and normal.   SENSATION: Slightly reduced on the right side.   NIH stroke scale 2.   Blood pressure (!) 153/74, pulse 78, temperature 98.2 F (36.8 C), temperature source Oral, resp. rate 20, height _0  (1.575 m), weight 143 lb 1.6 oz (64.9 kg), SpO2 97 %.  Past Medical History:  Diagnosis Date  . Allergy   . Anxiety   . Cataract   . Cystocele 09/05/2013  . GERD (gastroesophageal reflux disease)   . Glaucoma   . Hemorrhoids 09/05/2013  . Hypertension   . Rectocele 09/05/2013    Past Surgical History:  Procedure Laterality Date  . EYE SURGERY Left    tube placed and cataract removed  . HIP SURGERY Left 2010    hip replacement.    Family History  Problem Relation Age of Onset  . Cancer Mother   . Heart disease Father   . Heart attack Father   . Cancer Sister 7    breast   . Arthritis Daughter   . Asthma Daughter   . Diabetes Daughter   . Diabetes Paternal Grandmother   . Cancer Sister     colon   . Cancer Sister   . Stroke Sister   . Arthritis Sister   . Heart disease Sister   . Kidney disease Sister   . Cancer Sister     breast and lung   . Diabetes Sister   . Vision loss Brother   . Diabetes Brother   . Arthritis Son   . Alcohol abuse Son   . Alcohol  abuse Son   . Arthritis Son   . Depression Son   . Mental illness Son     Social History:  reports that she is a non-smoker but has been exposed to tobacco smoke. She has never used smokeless tobacco. She reports that she does not drink alcohol or use drugs.  Allergies:  Allergies  Allergen Reactions  . Morphine And Related Other (See Comments)    Altered mental status  . Codeine Palpitations    Medications: Prior to Admission medications   Medication Sig Start Date End Date Taking? Authorizing Provider  Multiple Vitamins-Minerals (CENTRUM SILVER PO) Take 1 tablet by mouth daily.   Yes Historical Provider, MD  Omega-3 Fatty  Acids (FISH OIL PO) Take 1 capsule by mouth daily.   Yes Historical Provider, MD  OMEPRAZOLE PO Take 1 capsule by mouth daily.   Yes Historical Provider, MD  acetaminophen (TYLENOL) 325 MG tablet Take 325 mg by mouth every 6 (six) hours as needed for pain.    Historical Provider, MD  ALPRAZolam Duanne Moron) 0.5 MG tablet Take 0.5 mg by mouth 3 (three) times daily as needed.  09/03/16   Historical Provider, MD  bimatoprost (LUMIGAN) 0.03 % ophthalmic solution Place 1 drop into the left eye at bedtime.     Historical Provider, MD  brimonidine (ALPHAGAN P) 0.1 % SOLN Place 1 drop into both eyes 2 (two) times daily.     Historical Provider, MD  dorzolamide-timolol (COSOPT) 22.3-6.8 MG/ML ophthalmic solution Place 1 drop into both eyes 2 (two) times daily.    Historical Provider, MD  lansoprazole (PREVACID) 15 MG capsule Take 15 mg by mouth daily.    Historical Provider, MD  loratadine (CLARITIN) 10 MG tablet Take 10 mg by mouth daily.    Historical Provider, MD  meclizine (ANTIVERT) 25 MG tablet Take 25 mg by mouth 3 (three) times daily as needed. 10/09/16   Historical Provider, MD  Menthol, Topical Analgesic, 4 % GEL Apply to [place] 11/01/13   Carole Civil, MD  valsartan (DIOVAN) 160 MG tablet Take 160 mg by mouth daily. 09/19/16   Historical Provider, MD    Scheduled Meds: . ALPRAZolam  0.5 mg Oral TID  . aspirin  300 mg Rectal Daily   Or  . aspirin  325 mg Oral Daily  . atorvastatin  40 mg Oral q1800  . brimonidine  1 drop Both Eyes TID  . dorzolamide-timolol  1 drop Both Eyes BID  . heparin  5,000 Units Subcutaneous Q8H  . latanoprost  1 drop Both Eyes QHS  . pantoprazole  40 mg Oral Daily   Continuous Infusions: . sodium chloride 50 mL/hr at 10/10/16 0139   PRN Meds:.acetaminophen, senna-docusate     Results for orders placed or performed during the hospital encounter of 10/09/16 (from the past 48 hour(s))  Ethanol     Status: None   Collection Time: 10/09/16   7:30 PM  Result Value Ref Range   Alcohol, Ethyl (B) <5 <5 mg/dL    Comment:        LOWEST DETECTABLE LIMIT FOR SERUM ALCOHOL IS 5 mg/dL FOR MEDICAL PURPOSES ONLY   Protime-INR     Status: None   Collection Time: 10/09/16  7:30 PM  Result Value Ref Range   Prothrombin Time 13.0 11.4 - 15.2 seconds   INR 0.99   APTT     Status: None   Collection Time: 10/09/16  7:30 PM  Result Value Ref Range  aPTT 29 24 - 36 seconds  CBC     Status: None   Collection Time: 10/09/16  7:30 PM  Result Value Ref Range   WBC 8.8 4.0 - 10.5 K/uL   RBC 4.44 3.87 - 5.11 MIL/uL   Hemoglobin 13.6 12.0 - 15.0 g/dL   HCT 40.4 36.0 - 46.0 %   MCV 91.0 78.0 - 100.0 fL   MCH 30.6 26.0 - 34.0 pg   MCHC 33.7 30.0 - 36.0 g/dL   RDW 13.4 11.5 - 15.5 %   Platelets 267 150 - 400 K/uL  Differential     Status: None   Collection Time: 10/09/16  7:30 PM  Result Value Ref Range   Neutrophils Relative % 58 %   Neutro Abs 5.2 1.7 - 7.7 K/uL   Lymphocytes Relative 31 %   Lymphs Abs 2.7 0.7 - 4.0 K/uL   Monocytes Relative 8 %   Monocytes Absolute 0.7 0.1 - 1.0 K/uL   Eosinophils Relative 2 %   Eosinophils Absolute 0.2 0.0 - 0.7 K/uL   Basophils Relative 1 %   Basophils Absolute 0.0 0.0 - 0.1 K/uL  Comprehensive metabolic panel     Status: Abnormal   Collection Time: 10/09/16  7:30 PM  Result Value Ref Range   Sodium 136 135 - 145 mmol/L   Potassium 3.2 (L) 3.5 - 5.1 mmol/L   Chloride 104 101 - 111 mmol/L   CO2 23 22 - 32 mmol/L   Glucose, Bld 118 (H) 65 - 99 mg/dL   BUN 10 6 - 20 mg/dL   Creatinine, Ser 0.74 0.44 - 1.00 mg/dL   Calcium 9.1 8.9 - 10.3 mg/dL   Total Protein 7.7 6.5 - 8.1 g/dL   Albumin 3.9 3.5 - 5.0 g/dL   AST 20 15 - 41 U/L   ALT 14 14 - 54 U/L   Alkaline Phosphatase 84 38 - 126 U/L   Total Bilirubin 0.5 0.3 - 1.2 mg/dL   GFR calc non Af Amer >60 >60 mL/min   GFR calc Af Amer >60 >60 mL/min    Comment: (NOTE) The eGFR has been calculated using the CKD EPI equation. This calculation  has not been validated in all clinical situations. eGFR's persistently <60 mL/min signify possible Chronic Kidney Disease.    Anion gap 9 5 - 15  I-Stat Chem 8, ED  (not at [facility], [facility])     Status: Abnormal   Collection Time: 10/09/16  7:43 PM  Result Value Ref Range   Sodium 141 135 - 145 mmol/L   Potassium 4.0 3.5 - 5.1 mmol/L   Chloride 104 101 - 111 mmol/L   BUN 11 6 - 20 mg/dL   Creatinine, Ser 0.70 0.44 - 1.00 mg/dL   Glucose, Bld 117 (H) 65 - 99 mg/dL   Calcium, Ion 1.12 (L) 1.15 - 1.40 mmol/L   TCO2 25 0 - 100 mmol/L   Hemoglobin 14.6 12.0 - 15.0 g/dL   HCT 43.0 36.0 - 46.0 %  I-stat troponin, ED (not at [facility], [facility])     Status: None   Collection Time: 10/09/16  7:43 PM  Result Value Ref Range   Troponin i, poc 0.01 0.00 - 0.08 ng/mL   Comment 3            Comment: Due to the release kinetics of cTnI, a negative result within the first hours of the onset of symptoms does not rule out myocardial infarction with certainty. If myocardial infarction is still  suspected, repeat the test at appropriate intervals.   Urinalysis, Routine w reflex microscopic (not at [facility])     Status: Abnormal   Collection Time: 10/10/16  5:00 AM  Result Value Ref Range   Color, Urine YELLOW YELLOW   APPearance CLEAR CLEAR   Specific Gravity, Urine >1.030 (H) 1.005 - 1.030   pH 5.5 5.0 - 8.0   Glucose, UA NEGATIVE NEGATIVE mg/dL   Hgb urine dipstick NEGATIVE NEGATIVE   Bilirubin Urine SMALL (A) NEGATIVE   Ketones, ur 40 (A) NEGATIVE mg/dL   Protein, ur TRACE (A) NEGATIVE mg/dL   Nitrite NEGATIVE NEGATIVE   Leukocytes, UA SMALL (A) NEGATIVE  Urine microscopic-add on     Status: Abnormal   Collection Time: 10/10/16  5:00 AM  Result Value Ref Range   Squamous Epithelial / LPF TOO NUMEROUS TO COUNT (A) NONE SEEN   WBC, UA TOO NUMEROUS TO COUNT 0 - 5 WBC/hpf   RBC / HPF NONE SEEN 0 - 5 RBC/hpf   Bacteria, UA MANY (A) NONE SEEN  Lipid panel     Status: Abnormal   Collection Time: 10/10/16  6:17  AM  Result Value Ref Range   Cholesterol 178 0 - 200 mg/dL   Triglycerides 64 <150 mg/dL   HDL 51 >40 mg/dL   Total CHOL/HDL Ratio 3.5 RATIO   VLDL 13 0 - 40 mg/dL   LDL Cholesterol 114 (H) 0 - 99 mg/dL    Comment:        Total Cholesterol/HDL:CHD Risk Coronary Heart Disease Risk Table                     Men   Women  1/2 Average Risk   3.4   3.3  Average Risk       5.0   4.4  2 X Average Risk   9.6   7.1  3 X Average Risk  23.4   11.0        Use the calculated Patient Ratio above and the CHD Risk Table to determine the patient's CHD Risk.        ATP III CLASSIFICATION (LDL):  <100     mg/dL   Optimal  100-129  mg/dL   Near or Above                    Optimal  130-159  mg/dL   Borderline  160-189  mg/dL   High  >190     mg/dL   Very High     Studies/Results:   CAROTID DOPPLERS normal   BRAIN MRI  FINDINGS: Brain: Study is limited as the patient was unable to tolerate the full length of the exam. Additionally, the images provided are degraded by motion artifact.  Diffusion-weighted sequences demonstrate abnormal restricted diffusion involving the posterior limb of the left internal capsule/left thalamus, compatible with acute ischemic infarct. This confirms the finding on prior CT. Infarct measures 13 x 10 x 13 mm. No associated mass effect. No findings to suggest hemorrhage.  No other evidence for acute ischemia. No definite mass lesion. No midline shift or mass effect. Ventricles normal in size without hydrocephalus. No extra-axial fluid collection.  Pituitary gland and suprasellar region normal. Midline structures intact.  Vascular: Major intracranial vascular flow voids grossly maintained.  Skull and upper cervical spine: Craniocervical junction normal. Visualized upper cervical spine unremarkable without significant degenerative changes are stenosis. Bone marrow signal intensity within normal limits. No scalp soft tissue  abnormality.  Sinuses/Orbits: Globes and orbital soft tissues grossly normal. Patient appears to be status post lens extraction bilaterally. Paranasal sinuses are clear. No mastoid effusion.  IMPRESSION: 1. 13 x 10 x 13 mm acute ischemic lacunar infarct involving the posterior limb of the left internal capsule. The previously questioned abnormality on prior CT is confirmed. 2. No other definite acute intracranial process on this limited motion degraded study.    TTE Pending    Dantrell Schertzer A. Merlene Laughter, M.D.  Diplomate, Tax adviser of Psychiatry and Neurology ( Neurology). 10/10/2016, 7:35 PM

## 2016-10-10 NOTE — Care Management Note (Signed)
Case Management Note  Patient Details  Name: Krista FlemingsBetty K Garton MRN: [id number] Date of Birth: [birth year]  Subjective/Objective:                  Pt admitted with CVA. Pt has seen pt and recommends SNF. Pt is agreeable. CSW is aware and working with pt/family on placement options.   Action/Plan: No CM needs anticipated.   Expected Discharge Date:      10/11/2016            Expected Discharge Plan:  Skilled Nursing Facility  In-House Referral:  Clinical Social Work  Discharge planning Services  CM Consult  Post Acute Care Choice:  NA Choice offered to:  NA  Status of Service:  Completed, signed off  Malcolm MetroChildress, Calia Napp Demske, RN 10/10/2016, 1:47 PM

## 2016-10-10 NOTE — Progress Notes (Signed)
Patient has been accepted to the [facility] for SNF at discharge. Daughter Zella BallRobin in room, made aware of plan and in agreement. Daughter will be here each day in case needs arise. Voices no concerns or questions at this time.  Deretha EmoryHannah Kaloni Bisaillon LCSW, MSW Clinical Social Work: Optician, dispensingystem Wide Float Coverage for :  [phone]

## 2016-10-10 NOTE — ED Notes (Signed)
Attempted to call report. Was advised nurse receiving pt will call this nurse back for report. 

## 2016-10-10 NOTE — Clinical Social Work Placement (Addendum)
   CLINICAL SOCIAL WORK PLACEMENT  NOTE  Date:  10/10/2016  Patient Details  Name: Krista FlemingsBetty K Randol MRN: [id number] Date of Birth: [birth year]  Clinical Social Work is seeking post-discharge placement for this patient at the Skilled  Nursing Facility level of care (*CSW will initial, date and re-position this form in  chart as items are completed):  Yes   Patient/family provided with [facility] Clinical Social Work Department's list of facilities offering this level of care within the geographic area requested by the patient (or if unable, by the patient's family).  Yes   Patient/family informed of their freedom to choose among providers that offer the needed level of care, that participate in Medicare, Medicaid or managed care program needed by the patient, have an available bed and are willing to accept the patient.  Yes   Patient/family informed of [facility]'s ownership interest in [facility] and [facility], as well as of the fact that they are under no obligation to receive care at these facilities.  PASRR submitted to EDS on 10/10/16     PASRR number received on 10/10/16     Existing PASRR number confirmed on       FL2 transmitted to all facilities in geographic area requested by pt/family on 10/10/16     FL2 transmitted to all facilities within larger geographic area on       Patient informed that his/her managed care company has contracts with or will negotiate with certain facilities, including the following:            Patient/family informed of bed offers received. 10/10/2016   Patient [facility] bed at      [facility]  Physician recommends and patient chooses bed at     [facility] Patient to be transferred to   on  .     Patient to be transferred to facility by       Patient family notified on   of transfer. daughter  Name of family member notified:      Robin  PHYSICIAN Please sign FL2     Additional Comment:     _______________________________________________ Raye Sorrowoble, Alleya Demeter N, LCSW 10/10/2016, 11:20 AM

## 2016-10-10 NOTE — Evaluation (Addendum)
Occupational Therapy Evaluation Patient Details Name: Krista Munoz MRN: [id number] DOB: September 29, 1931 Today's Date: 10/10/2016    History of Present Illness Krista FlemingsBetty K Rita is a 82 y.o. female with a past medical history significant for GERD, and HTN, presented with complaints of right-sided weakness with ictus 2 days ago.  She denies any visual problems, headaches or slurred speech Patient had a head CT as an outpatient which was consistent for an acute CVA, she was referred to the hospital. While in the ED, Head CT and  MRI were positive for an acute CVA and she has been started on a stroke work-up. Hospitalist was asked to admit the patient for further evaluation of a left cerebrovascular accident. She has not been on aspirin or statin PTA. MRI positive for left lacunar CVA   Clinical Impression   Pt awake, alert, oriented x4 this am, agreeable to OT evaluation with family in room. Pt demonstrates improved RUE strength at 4-/5 throughout, sensation and coordination intact. Pt is able to perform ADLs while seated with supervision however requires mod assist in standing due to poor static and dynamic standing balance. Min/mod assist during functional mobility with RW. Discussed discharge with pt and family, pt is not safe to return home alone at this point.  Recommend recommend SNF to improve safety and independence in ADL and functional mobility tasks, as pt family is unable to provide 24/7 supervision/assistance.     Follow Up Recommendations  SNF   Equipment Recommendations  None recommended by OT       Precautions / Restrictions Precautions Precautions: Fall Restrictions Weight Bearing Restrictions: No      Mobility Bed Mobility Overal bed mobility: Needs Assistance Bed Mobility: Sit to Supine       Sit to supine: Min guard      Transfers Overall transfer level: Needs assistance Equipment used: Rolling walker (2 wheeled) Transfers: Sit to/from Stand Sit to Stand:  Min assist                   ADL Overall ADL's : Needs assistance/impaired                     Lower Body Dressing: Supervision/safety;Sitting/lateral leans Lower Body Dressing Details (indicate cue type and reason): Pt is able to perform dressing tasks while seated, however requires assistance in standing due balance deficits             Functional mobility during ADLs: Minimal assistance;Moderate assistance;Cueing for safety;Rolling walker General ADL Comments: Pt is unable to perform tasks in standing due to poor static and dynamic standing balance     Vision Vision Assessment?: No apparent visual deficits Additional Comments: Pt has peripheral vision deficits at baseline          Pertinent Vitals/Pain Pain Assessment: No/denies pain     Hand Dominance Right   Extremity/Trunk Assessment Upper Extremity Assessment Upper Extremity Assessment: RUE deficits/detail RUE Deficits / Details: RUE strength 4-/5 throughout   Lower Extremity Assessment Lower Extremity Assessment: Defer to PT evaluation   Cervical / Trunk Assessment Cervical / Trunk Assessment: Normal   Communication Communication Communication: No difficulties   Cognition Arousal/Alertness: Awake/alert Behavior During Therapy: WFL for tasks assessed/performed Overall Cognitive Status: Within Functional Limits for tasks assessed                                Home Living   Living Arrangements:  Alone Available Help at Discharge: Family;Available PRN/intermittently Type of Home: House             Bathroom Shower/Tub: Tub/shower unit         Home Equipment: None          Prior Functioning/Environment Level of Independence: Independent        Comments: P independent with ADL and IADL tasks, driving        OT Problem List: Decreased strength;Decreased activity tolerance;Impaired balance (sitting and/or standing);Decreased knowledge of use of DME or AE   OT  Treatment/Interventions: Self-care/ADL training;Therapeutic exercise;Therapeutic activities;Patient/family education    OT Goals(Current goals can be found in the care plan section) Acute Rehab OT Goals Patient Stated Goal: To be independent OT Goal Formulation: With patient Time For Goal Achievement: 10/24/16 Potential to Achieve Goals: Good  OT Frequency: Min 2X/week    End of Session Equipment Utilized During Treatment: Gait belt;Rolling walker  Activity Tolerance: Patient tolerated treatment well Patient left: in bed;with call bell/phone within reach;with bed alarm set;with family/visitor present   Time: [id number] OT Time Calculation (min): 22 min Charges:  OT General Charges $OT Visit: 1 Procedure OT Evaluation $OT Eval Moderate Complexity: 1 Procedure G-Codes: OT G-codes **NOT FOR INPATIENT CLASS** Functional Assessment Tool Used: clinical judgement Functional Limitation: Self care Self Care Current Status ([id number](G8987): At least 40 percent but less than 60 percent impaired, limited or restricted Self Care Goal Status ([id number](G8988): At least 40 percent but less than 60 percent impaired, limited or restricted Self Care Discharge Status [phone]([id number]): At least 40 percent but less than 60 percent impaired, limited or restricted   Ezra SitesLeslie Samanvi Cuccia, OTR/L  [phone] 10/10/2016, 10:19 AM

## 2016-10-10 NOTE — ED Notes (Signed)
Attempted to call report, no answer

## 2016-10-10 NOTE — Evaluation (Signed)
Speech Language Pathology Evaluation Patient Details Name: Krista Munoz MRN: [id number] DOB: 1931/07/12 Today's Date: 10/10/2016 Time: [id number] SLP Time Calculation (min) (ACUTE ONLY): 24 min  Problem List:  Patient Active Problem List   Diagnosis Date Noted  . Acute CVA (cerebrovascular accident) (HCC) 10/09/2016  . HTN (hypertension) 10/09/2016  . Rectocele 09/05/2013  . Cystocele 09/05/2013  . Hemorrhoids 09/05/2013  . TOTAL HIP FOLLOW-UP 03/06/2009  . Osteoarthrosis, unspecified whether generalized or localized, pelvic region and thigh 01/10/2009  . HIP PAIN 01/10/2009   Past Medical History:  Past Medical History:  Diagnosis Date  . Allergy   . Anxiety   . Cataract   . Cystocele 09/05/2013  . GERD (gastroesophageal reflux disease)   . Glaucoma   . Hemorrhoids 09/05/2013  . Hypertension   . Rectocele 09/05/2013   Past Surgical History:  Past Surgical History:  Procedure Laterality Date  . EYE SURGERY Left    tube placed and cataract removed  . HIP SURGERY Left 2010    hip replacement.   HPI:  Krista Munoz a 82 y.o.femalewith a pastmedical history significant for GERD, and HTN, presented with complaints of right-sided weakness with ictus2 days ago. She denies any visual problems, headaches or slurred speech Patient had a head CT as an outpatient which was consistent for an acute CVA, she was referred to the hospital. While in the ED, Head CT and MRI werepositive for an acute ischemic lacunar infarct involving the   Assessment / Plan / Recommendation Clinical Impression  Pt presents with mild cognitive deficits as evidenced by the MOCA: 23/30. Deficits characterized by reduced novel recall and reduced executive function skills. Expressive and receptive language abilities appear intact as well as motor speech abilities. Pt does exhibit and self report a change in graphic expression felt secondary to right UE weakness. Note PLOF pt was independent with all  ADLs basic and complex and lived alone. Pt reports that family is unable to provide assistance 24 hours a day which has been recommended by OT and PT. Recommend short term SNF placement to address previously identified needs. All further needs can be addressed at next level of care.     SLP Assessment  Patient needs continued Speech Lanaguage Pathology Services    Follow Up Recommendations  Skilled Nursing facility;24 hour supervision/assistance    Frequency and Duration           SLP Evaluation Cognition  Overall Cognitive Status: Impaired/Different from baseline Arousal/Alertness: Awake/alert Orientation Level: Oriented X4 Memory: Impaired Memory Impairment: Decreased recall of new information;Retrieval deficit Awareness: Impaired Executive Function: Sequencing;Self Monitoring;Organizing Sequencing: Impaired Sequencing Impairment: Verbal complex       Comprehension  Auditory Comprehension Overall Auditory Comprehension: Appears within functional limits for tasks assessed Visual Recognition/Discrimination Discrimination: Within Function Limits Reading Comprehension Reading Status: Within funtional limits    Expression Expression Primary Mode of Expression: Verbal Verbal Expression Overall Verbal Expression: Appears within functional limits for tasks assessed Written Expression Dominant Hand: Right (though right UE weakness present affecting graphic expressio)   Oral / Motor  Oral Motor/Sensory Function Overall Oral Motor/Sensory Function: Within functional limits Motor Speech Overall Motor Speech: Appears within functional limits for tasks assessed   GO                   Krista Duoshelsea Sumney MA, CCC-SLP Acute Care Speech Language Pathologist    Krista Munoz, Krista Munoz 10/10/2016, 4:47 PM

## 2016-10-10 NOTE — NC FL2 (Signed)
[place] MEDICAID FL2 LEVEL OF CARE SCREENING TOOL     IDENTIFICATION  Patient Name: Krista Munoz Birthdate: 21-Aug-1931 Sex: female Admission Date (Current Location): 10/09/2016  [facility] [place]  [place]   Facility and Address:  [facility],  [address], [place] [id number]      Provider Number: [id number]  Attending Physician Name and Address:  Kari BaarsEdward Hawkins, MD  Relative Name and Phone Number:       Current Level of Care: Hospital Recommended Level of Care: Skilled Nursing Facility Prior Approval Number:    Date Approved/Denied:   PASRR Number:   [id number]   Discharge Plan: SNF    Current Diagnoses: Patient Active Problem List   Diagnosis Date Noted  . Acute CVA (cerebrovascular accident) (HCC) 10/09/2016  . HTN (hypertension) 10/09/2016  . Rectocele 09/05/2013  . Cystocele 09/05/2013  . Hemorrhoids 09/05/2013  . TOTAL HIP FOLLOW-UP 03/06/2009  . Osteoarthrosis, unspecified whether generalized or localized, pelvic region and thigh 01/10/2009  . HIP PAIN 01/10/2009    Orientation RESPIRATION BLADDER Height & Weight     Self, Time, Situation, Place  Normal Continent Weight: 143 lb 1.6 oz (64.9 kg) Height:  5\' 2"  (157.5 cm)  BEHAVIORAL SYMPTOMS/MOOD NEUROLOGICAL BOWEL NUTRITION STATUS   calm and cooperative stable Continent Diet (regular)  AMBULATORY STATUS COMMUNICATION OF NEEDS Skin   Extensive Assist Verbally Normal                       Personal Care Assistance Level of Assistance  Bathing, Feeding, Dressing Bathing Assistance: Limited assistance Feeding assistance: Independent Dressing Assistance: Limited assistance     Functional Limitations Info  Sight, Hearing, Speech Sight Info: Impaired Hearing Info: Adequate Speech Info: Adequate    SPECIAL CARE FACTORS FREQUENCY  PT (By licensed PT), OT (By licensed OT)     PT Frequency: 5x OT Frequency: 5x            Contractures Contractures Info:  Not present    Additional Factors Info  Code Status, Allergies Code Status Info: Full Code Allergies Info: Morphine and related, codeine           Current Medications (10/10/2016):  This is the current hospital active medication list Current Facility-Administered Medications  Medication Dose Route Frequency Provider Last Rate Last Dose  .  stroke: mapping our early stages of recovery book   Does not apply Once Houston SirenPeter Le, MD      . 0.9 %  sodium chloride infusion   Intravenous Continuous Houston SirenPeter Le, MD 50 mL/hr at 10/10/16 0139    . acetaminophen (TYLENOL) tablet 325 mg  325 mg Oral Q4H PRN Houston SirenPeter Le, MD      . ALPRAZolam Prudy Feeler(XANAX) tablet 0.5 mg  0.5 mg Oral TID Houston SirenPeter Le, MD   0.5 mg at 10/10/16 1107  . aspirin suppository 300 mg  300 mg Rectal Daily Houston SirenPeter Le, MD       Or  . aspirin tablet 325 mg  325 mg Oral Daily Houston SirenPeter Le, MD   325 mg at 10/10/16 1106  . atorvastatin (LIPITOR) tablet 40 mg  40 mg Oral q1800 Houston SirenPeter Le, MD      . brimonidine (ALPHAGAN) 0.15 % ophthalmic solution 1 drop  1 drop Both Eyes TID Houston SirenPeter Le, MD   1 drop at 10/10/16 1108  . dorzolamide-timolol (COSOPT) 22.3-6.8 MG/ML ophthalmic solution 1 drop  1 drop Both Eyes BID Houston SirenPeter Le, MD   1 drop at  10/10/16 1108  . heparin injection 5,000 Units  5,000 Units Subcutaneous Q8H Houston SirenPeter Le, MD   5,000 Units at 10/10/16 [phone]  . latanoprost (XALATAN) 0.005 % ophthalmic solution 1 drop  1 drop Both Eyes QHS Houston SirenPeter Le, MD      . pantoprazole (PROTONIX) EC tablet 40 mg  40 mg Oral Daily Houston SirenPeter Le, MD   40 mg at 10/10/16 1107  . senna-docusate (Senokot-S) tablet 1 tablet  1 tablet Oral QHS PRN Houston SirenPeter Le, MD         Discharge Medications: Please see discharge summary for a list of discharge medications.  Relevant Imaging Results:  Relevant Lab Results:   Additional Information SSN:  [id number]  Raye SorrowCoble, Hoke Baer N, [place]

## 2016-10-10 NOTE — Progress Notes (Signed)
Subjective: She was admitted with subacute stroke. She still has some right-sided weakness. She is reluctant to ascribe that to a stroke and thinks it's more related to arthritis. She has some dizziness as well. No pain. No nausea or vomiting. No visual disturbance.  Objective: Vital signs in last 24 hours: Temp:  [98 F (36.7 C)-98.6 F (37 C)] 98.6 F (37 C) (11/10 0700) Pulse Rate:  [73-96] 82 (11/10 0700) Resp:  [17-20] 20 (11/10 0700) BP: (120-162)/(57-89) 131/57 (11/10 0700) SpO2:  [91 %-99 %] 96 % (11/10 0700) Weight:  [64.9 kg (143 lb 1.6 oz)-68 kg (150 lb)] 64.9 kg (143 lb 1.6 oz) (11/10 0100) Weight change:     Intake/Output from previous day: 11/09 0701 - 11/10 0700 In: 217.5 [I.V.:217.5] Out: 100 [Urine:100]  PHYSICAL EXAM General appearance: alert, cooperative and mild distress Resp: clear to auscultation bilaterally Cardio: regular rate and rhythm, S1, S2 normal, no murmur, click, rub or gallop GI: soft, non-tender; bowel sounds normal; no masses,  no organomegaly Extremities: She does have significant arthritic changes in her extremities and has trigger fingers in her right hand She has right hemiparesis. Mucous membranes are moist. Skin is warm and dry.  Lab Results:  Results for orders placed or performed during the hospital encounter of 10/09/16 (from the past 48 hour(s))  Ethanol     Status: None   Collection Time: 10/09/16  7:30 PM  Result Value Ref Range   Alcohol, Ethyl (B) <5 <5 mg/dL    Comment:        LOWEST DETECTABLE LIMIT FOR SERUM ALCOHOL IS 5 mg/dL FOR MEDICAL PURPOSES ONLY   Protime-INR     Status: None   Collection Time: 10/09/16  7:30 PM  Result Value Ref Range   Prothrombin Time 13.0 11.4 - 15.2 seconds   INR 0.99   APTT     Status: None   Collection Time: 10/09/16  7:30 PM  Result Value Ref Range   aPTT 29 24 - 36 seconds  CBC     Status: None   Collection Time: 10/09/16  7:30 PM  Result Value Ref Range   WBC 8.8 4.0 - 10.5 K/uL    RBC 4.44 3.87 - 5.11 MIL/uL   Hemoglobin 13.6 12.0 - 15.0 g/dL   HCT 40.4 36.0 - 46.0 %   MCV 91.0 78.0 - 100.0 fL   MCH 30.6 26.0 - 34.0 pg   MCHC 33.7 30.0 - 36.0 g/dL   RDW 13.4 11.5 - 15.5 %   Platelets 267 150 - 400 K/uL  Differential     Status: None   Collection Time: 10/09/16  7:30 PM  Result Value Ref Range   Neutrophils Relative % 58 %   Neutro Abs 5.2 1.7 - 7.7 K/uL   Lymphocytes Relative 31 %   Lymphs Abs 2.7 0.7 - 4.0 K/uL   Monocytes Relative 8 %   Monocytes Absolute 0.7 0.1 - 1.0 K/uL   Eosinophils Relative 2 %   Eosinophils Absolute 0.2 0.0 - 0.7 K/uL   Basophils Relative 1 %   Basophils Absolute 0.0 0.0 - 0.1 K/uL  Comprehensive metabolic panel     Status: Abnormal   Collection Time: 10/09/16  7:30 PM  Result Value Ref Range   Sodium 136 135 - 145 mmol/L   Potassium 3.2 (L) 3.5 - 5.1 mmol/L   Chloride 104 101 - 111 mmol/L   CO2 23 22 - 32 mmol/L   Glucose, Bld 118 (H) 65 - 99  mg/dL   BUN 10 6 - 20 mg/dL   Creatinine, Ser 0.74 0.44 - 1.00 mg/dL   Calcium 9.1 8.9 - 10.3 mg/dL   Total Protein 7.7 6.5 - 8.1 g/dL   Albumin 3.9 3.5 - 5.0 g/dL   AST 20 15 - 41 U/L   ALT 14 14 - 54 U/L   Alkaline Phosphatase 84 38 - 126 U/L   Total Bilirubin 0.5 0.3 - 1.2 mg/dL   GFR calc non Af Amer >60 >60 mL/min   GFR calc Af Amer >60 >60 mL/min    Comment: (NOTE) The eGFR has been calculated using the CKD EPI equation. This calculation has not been validated in all clinical situations. eGFR's persistently <60 mL/min signify possible Chronic Kidney Disease.    Anion gap 9 5 - 15  I-Stat Chem 8, ED  (not at [facility], [facility])     Status: Abnormal   Collection Time: 10/09/16  7:43 PM  Result Value Ref Range   Sodium 141 135 - 145 mmol/L   Potassium 4.0 3.5 - 5.1 mmol/L   Chloride 104 101 - 111 mmol/L   BUN 11 6 - 20 mg/dL   Creatinine, Ser 0.70 0.44 - 1.00 mg/dL   Glucose, Bld 117 (H) 65 - 99 mg/dL   Calcium, Ion 1.12 (L) 1.15 - 1.40 mmol/L   TCO2 25 0 - 100 mmol/L    Hemoglobin 14.6 12.0 - 15.0 g/dL   HCT 43.0 36.0 - 46.0 %  I-stat troponin, ED (not at [facility], [facility])     Status: None   Collection Time: 10/09/16  7:43 PM  Result Value Ref Range   Troponin i, poc 0.01 0.00 - 0.08 ng/mL   Comment 3            Comment: Due to the release kinetics of cTnI, a negative result within the first hours of the onset of symptoms does not rule out myocardial infarction with certainty. If myocardial infarction is still suspected, repeat the test at appropriate intervals.   Urinalysis, Routine w reflex microscopic (not at [facility])     Status: Abnormal   Collection Time: 10/10/16  5:00 AM  Result Value Ref Range   Color, Urine YELLOW YELLOW   APPearance CLEAR CLEAR   Specific Gravity, Urine >1.030 (H) 1.005 - 1.030   pH 5.5 5.0 - 8.0   Glucose, UA NEGATIVE NEGATIVE mg/dL   Hgb urine dipstick NEGATIVE NEGATIVE   Bilirubin Urine SMALL (A) NEGATIVE   Ketones, ur 40 (A) NEGATIVE mg/dL   Protein, ur TRACE (A) NEGATIVE mg/dL   Nitrite NEGATIVE NEGATIVE   Leukocytes, UA SMALL (A) NEGATIVE  Urine microscopic-add on     Status: Abnormal   Collection Time: 10/10/16  5:00 AM  Result Value Ref Range   Squamous Epithelial / LPF TOO NUMEROUS TO COUNT (A) NONE SEEN   WBC, UA TOO NUMEROUS TO COUNT 0 - 5 WBC/hpf   RBC / HPF NONE SEEN 0 - 5 RBC/hpf   Bacteria, UA MANY (A) NONE SEEN  Lipid panel     Status: Abnormal   Collection Time: 10/10/16  6:17 AM  Result Value Ref Range   Cholesterol 178 0 - 200 mg/dL   Triglycerides 64 <150 mg/dL   HDL 51 >40 mg/dL   Total CHOL/HDL Ratio 3.5 RATIO   VLDL 13 0 - 40 mg/dL   LDL Cholesterol 114 (H) 0 - 99 mg/dL    Comment:        Total Cholesterol/HDL:CHD Risk  Coronary Heart Disease Risk Table                     Men   Women  1/2 Average Risk   3.4   3.3  Average Risk       5.0   4.4  2 X Average Risk   9.6   7.1  3 X Average Risk  23.4   11.0        Use the calculated Patient Ratio above and the CHD Risk Table to determine the  patient's CHD Risk.        ATP III CLASSIFICATION (LDL):  <100     mg/dL   Optimal  100-129  mg/dL   Near or Above                    Optimal  130-159  mg/dL   Borderline  160-189  mg/dL   High  >190     mg/dL   Very High     ABGS  Recent Labs  10/09/16 1943  TCO2 25   CULTURES No results found for this or any previous visit (from the past 240 hour(s)). Studies/Results: Ct Head Wo Contrast  Result Date: 10/09/2016 CLINICAL DATA:  Initial evaluation for possible stroke. Slurred speech with right-sided weakness. EXAM: CT HEAD WITHOUT CONTRAST TECHNIQUE: Contiguous axial images were obtained from the base of the skull through the vertex without intravenous contrast. COMPARISON:  Comparison made with prior head CT from 10/26/2013. FINDINGS: Brain: Mild diffuse prominence of the CSF containing spaces is compatible with generalized cerebral atrophy. Patchy and confluent hypodensity within the periventricular and deep white matter both cerebral hemispheres most consistent with chronic microvascular ischemic changes. These changes have mildly progressed relative to 2014. There is a somewhat age indeterminate hypodensity within the posterior limb of the left internal capsule measuring 11 mm (series 5, image 31). Finding is somewhat age indeterminate, but could be acute/subacute in nature. No associated hemorrhage or mass effect. No evidence for acute intracranial hemorrhage. Gray-white matter differentiation otherwise maintained without evidence for acute large vessel territory infarct. Deep gray nuclei maintained. No mass lesion, midline shift or mass effect. No hydrocephalus. No extra-axial fluid collection. Vascular: No hyperdense vessel identified. Scattered vascular calcifications present within the carotid siphons. Skull: Scalp soft tissues demonstrate no acute abnormality. Calvarium intact. Sinuses/Orbits: Visualized globes and orbits within normal limits. Visualized paranasal sinuses are  clear. No mastoid effusion. IMPRESSION: 1. 11 mm hypodensity within the posterior limb of the left internal capsule, somewhat age indeterminate, but could reflect an acute/subacute ischemic infarct. This could be further assessed with dedicated MRI as clinically indicated. No associated hemorrhage or mass effect. 2. No other acute intracranial process identified. 3. Mild age-related cerebral atrophy with chronic small vessel ischemic disease, progressed relative to most recent CT from 2014. Results were called by telephone at the time of interpretation on 10/09/2016 at 5:21 pm to Dr. Sinda Du , who verbally acknowledged these results. Electronically Signed   By: Jeannine Boga M.D.   On: 10/09/2016 17:26   Mr Brain Wo Contrast (neuro Protocol)  Result Date: 10/09/2016 CLINICAL DATA:  Initial evaluation for acute stroke. EXAM: MRI HEAD WITHOUT CONTRAST TECHNIQUE: Multiplanar, multiecho pulse sequences of the brain and surrounding structures were obtained without intravenous contrast. COMPARISON:  Prior CT from earlier the same day. FINDINGS: Brain: Study is limited as the patient was unable to tolerate the full length of the exam. Additionally, the images provided are  degraded by motion artifact. Diffusion-weighted sequences demonstrate abnormal restricted diffusion involving the posterior limb of the left internal capsule/left thalamus, compatible with acute ischemic infarct. This confirms the finding on prior CT. Infarct measures 13 x 10 x 13 mm. No associated mass effect. No findings to suggest hemorrhage. No other evidence for acute ischemia. No definite mass lesion. No midline shift or mass effect. Ventricles normal in size without hydrocephalus. No extra-axial fluid collection. Pituitary gland and suprasellar region normal. Midline structures intact. Vascular: Major intracranial vascular flow voids grossly maintained. Skull and upper cervical spine: Craniocervical junction normal. Visualized  upper cervical spine unremarkable without significant degenerative changes are stenosis. Bone marrow signal intensity within normal limits. No scalp soft tissue abnormality. Sinuses/Orbits: Globes and orbital soft tissues grossly normal. Patient appears to be status post lens extraction bilaterally. Paranasal sinuses are clear. No mastoid effusion. IMPRESSION: 1. 13 x 10 x 13 mm acute ischemic lacunar infarct involving the posterior limb of the left internal capsule. The previously questioned abnormality on prior CT is confirmed. 2. No other definite acute intracranial process on this limited motion degraded study. Electronically Signed   By: Jeannine Boga M.D.   On: 10/09/2016 20:31    Medications:  Prior to Admission:  Prescriptions Prior to Admission  Medication Sig Dispense Refill Last Dose  . acetaminophen (TYLENOL) 325 MG tablet Take 325 mg by mouth every 6 (six) hours as needed for pain.   Past Week at Unknown time  . ALPRAZolam (XANAX) 0.5 MG tablet Take 0.5 mg by mouth 3 (three) times daily.     . bimatoprost (LUMIGAN) 0.03 % ophthalmic solution Place 1 drop into both eyes at bedtime.   10/26/2013 at Unknown time  . brimonidine (ALPHAGAN P) 0.1 % SOLN Place 1 drop into both eyes 3 (three) times daily.   10/26/2013 at Unknown time  . dorzolamide-timolol (COSOPT) 22.3-6.8 MG/ML ophthalmic solution Place 1 drop into both eyes 2 (two) times daily.   10/26/2013 at Unknown time  . lansoprazole (PREVACID) 15 MG capsule Take 15 mg by mouth daily.   10/26/2013 at Unknown time  . loratadine (CLARITIN) 10 MG tablet Take 10 mg by mouth daily.   Past Month at Unknown time  . meclizine (ANTIVERT) 25 MG tablet Take 25 mg by mouth 3 (three) times daily as needed.     . Menthol, Topical Analgesic, 4 % GEL Apply to [place] 1 Tube 5   . Multiple Vitamins-Minerals (CENTRUM SILVER PO) Take 1 tablet by mouth daily.   10/26/2013 at Unknown time  . olmesartan (BENICAR) 20 MG tablet Take  20 mg by mouth daily.   10/26/2013 at Unknown time  . valsartan (DIOVAN) 160 MG tablet Take 160 mg by mouth daily.      Scheduled: .  stroke: mapping our early stages of recovery book   Does not apply Once  . ALPRAZolam  0.5 mg Oral TID  . aspirin  300 mg Rectal Daily   Or  . aspirin  325 mg Oral Daily  . atorvastatin  40 mg Oral q1800  . brimonidine  1 drop Both Eyes TID  . dorzolamide-timolol  1 drop Both Eyes BID  . heparin  5,000 Units Subcutaneous Q8H  . latanoprost  1 drop Both Eyes QHS  . pantoprazole  40 mg Oral Daily   Continuous: . sodium chloride 50 mL/hr at 10/10/16 0139   FOY:DXAJOINOMVEHM, senna-docusate  Assesment:She has had a stroke. She has hypertension. She is undergoing stroke  workup. I will request neurology consultation. Principal Problem:   Acute CVA (cerebrovascular accident) ([place]) Active Problems:   HTN (hypertension)    Plan: As above.    LOS: 0 days   Anurag Scarfo L 10/10/2016, 8:53 AM

## 2016-10-10 NOTE — Care Management Important Message (Signed)
Important Message  Patient Details  Name: Krista Munoz MRN: [id number] Date of Birth: February 27, 1931   Medicare Important Message Given:  Yes    Malcolm MetroChildress, Alvar Malinoski Demske, RN 10/10/2016, 1:49 PM

## 2016-10-10 NOTE — Clinical Social Work Note (Signed)
Clinical Social Work Assessment  Patient Details  Name: Krista Munoz MRN: [id number] Date of Birth: January 01, 1931  Date of referral:  10/10/16               Reason for consult:  Facility Placement, Discharge Planning                Permission sought to share information with:  Case Manager, Customer service manager, Family Supports Permission granted to share information::  Yes, Verbal Permission Granted  Name::        Agency::  [facility]  Relationship::  Daughter in [place] Information:     Housing/Transportation Living arrangements for the past 2 months:  [place] of Information:  Patient, Medical Team, Case Manager, Adult Children Patient Interpreter Needed:  None Criminal Activity/Legal Involvement Pertinent to Current Situation/Hospitalization:  No - Comment as needed Significant Relationships:  Adult Children, Other Family Members Lives with:  Self Do you feel safe going back to the place where you live?  No Need for family participation in patient care:  Yes (Comment)  Care giving concerns:  LCSW spoke with daughter at bedside as patient was working with Therapist, sports.  Patient lives alone in home and family comes to check in on her regularly. Reports she was very independent prior to this stroke with ADLs and walking.  Currently needing 24 hour assistance and family reports they all work and unable to manage or care for her at level that is warranted. Open to SNF and would like [facility] as this is close to family in the area.    Social Worker assessment / plan:  LCSW completed assessment with daughter and patient. Agreeable to consult and explained role and reason for services while in hospital. Educated daughter regarding insurance and authorization. Completed SNF work up and completed FL2 , MD please sign.  [facility] has accepted patient for SNF. Patient will possibly leave over weekend or Monday. Will need insurance  authorization.  Plan: SNF when medically stable.  Employment status:  Retired Nurse, adult PT Recommendations:  [place], [place] / Referral to community resources:  [place]  Patient/Family's Response to care:  Agreeable to plan  Patient/Family's Understanding of and Emotional Response to Diagnosis, Current Treatment, and Prognosis:  LCSW met with daughter  And pt understanding of prognosis and plan  Emotional Assessment Appearance:  Appears stated age Attitude/Demeanor/Rapport:  Other (cooperative and approrpiate) Affect (typically observed):  Accepting, Adaptable, Calm Orientation:  Oriented to Self, Oriented to Place, Oriented to  Time, Oriented to Situation Alcohol / Substance use:  Not Applicable Psych involvement (Current and /or in the community):  No (Comment)  Discharge Needs  Concerns to be addressed:  No discharge needs identified Readmission within the last 30 days:  No Current discharge risk:  None Barriers to Discharge:  Continued Medical Work up   [place], LCSW 10/10/2016, 11:27 AM

## 2016-10-10 NOTE — Evaluation (Signed)
Physical Therapy Evaluation Patient Details Name: Krista FlemingsBetty K Koval MRN: [id number] DOB: [birth year] Today's Date: 10/10/2016   History of Present Illness  Krista Munoz is a 82 y.o. female with a past medical history significant for GERD, and HTN, presented with complaints of right-sided weakness with ictus 2 days ago.  She denies any visual problems, headaches or slurred speech Patient had a head CT as an outpatient which was consistent for an acute CVA, she was referred to the hospital. While in the ED, Head CT and  MRI were positive for an acute CVA and she has been started on a stroke work-up. Hospitalist was asked to admit the patient for further evaluation of a left cerebrovascular accident. She has not been on aspirin or statin PTA. MRI positive for left lacunar CVA  Clinical Impression  Pt received in bed with dtr present, and is agreeable to PT evaluation.  Pt expressed that PTA, she was independent with ambulation, ADL's, and IADL's.  She is still driving.  Pt reported 1 fall on Wednesday where she was turning the corner with the RW (due to feeling unsteady that day), and fell.  During PT evaluation she demonstrates R sided weakness, as well as significant incoordination on the R side.  She completed 5x sit<>stand in 20.67 seconds (norm is <12seconds), and she was able to ambulate 8930ft with RW and Min A due to ataxic gait with scissoring and LOB which required assist from PT to prevent falling.  At this time, she is recommended for 24/7 supervision/assistance, which family is not able to provide at this time, therefore, she will need SNF to continue to progress strength, balance, coordination and endurance needed to return to her PLOF.     Follow Up Recommendations SNF;Supervision/Assistance - 24 hour    Equipment Recommendations  None recommended by PT    Recommendations for Other Services       Precautions / Restrictions Precautions Precautions: Fall Precaution Comments: Pt fell on  Wednesday night when using the RW and when she was turning to go from the kitchen to the living room.  Pt states she couldn't balance herself.  Restrictions Weight Bearing Restrictions: No      Mobility  Bed Mobility Overal bed mobility: Independent Bed Mobility: Supine to Sit     Supine to sit: Independent (increased time) Sit to supine: Min guard      Transfers Overall transfer level: Needs assistance Equipment used: Rolling walker (2 wheeled) Transfers: Sit to/from Stand Sit to Stand: Min guard            Ambulation/Gait Ambulation/Gait assistance: Min assist Ambulation Distance (Feet): 30 Feet Assistive device: Rolling walker (2 wheeled) Gait Pattern/deviations: Scissoring;Step-through pattern;Ataxic;Narrow base of support   Gait velocity interpretation: <1.8 ft/sec, indicative of risk for recurrent falls General Gait Details: Pt demonstrates unsteadiness with gait due to narrow BOS and scissoring.  Pt also demonstrates LOB with turning right, which required assistance from PT to prevent fall.   Stairs            Wheelchair Mobility    Modified Rankin (Stroke Patients Only)       Balance Overall balance assessment: Needs assistance Sitting-balance support: Bilateral upper extremity supported;Feet supported Sitting balance-Leahy Scale: Good     Standing balance support: Bilateral upper extremity supported Standing balance-Leahy Scale: Fair  Pertinent Vitals/Pain Pain Assessment: 0-10 Pain Location: R shoulder pain - pt states it's been like that for a long time (months)  Pt states it only hurts when she moves it in.    Home Living   Living Arrangements: Alone (grand children stay with her at night) Available Help at Discharge: Family;Available PRN/intermittently Type of Home: House Home Access: Stairs to enter   Entrance Stairs-Number of Steps: 2 steps with HR.  Home Layout: One level Home  Equipment: Bedside commode;Walker - 2 wheels;Cane - single point      Prior Function Level of Independence: Independent         Comments: independent with dressing, bathing, lawn care, grandson mows the grass, driving short distances, with dtr driving her out of town.  Normally does not use the RW, however, has been using it the past few days due to feeling dizzy.      Hand Dominance   Dominant Hand: Right    Extremity/Trunk Assessment   Upper Extremity Assessment: Defer to OT evaluation RUE Deficits / Details: Noted impaired rapid alternating movement with supination and pronation, as well as ataxia and dysmetria with finger to nose.         Lower Extremity Assessment: RLE deficits/detail RLE Deficits / Details: hip flexion 4/5, knee extension 4+/5.  Impaired rapid alternating movement - slowed and very uncoordinated.  Impaired heel to shin.    Cervical / Trunk Assessment: Normal  Communication   Communication: No difficulties  Cognition Arousal/Alertness: Awake/alert Behavior During Therapy: WFL for tasks assessed/performed Overall Cognitive Status: Within Functional Limits for tasks assessed                      General Comments      Exercises Other Exercises Other Exercises: 5 x sit <>stand with B UE use and support in 20.67 seconds (norm is <12seconds).   Assessment/Plan    PT Assessment Patient needs continued PT services  PT Problem List Decreased strength;Decreased activity tolerance;Decreased balance;Decreased mobility;Decreased coordination;Decreased knowledge of use of DME;Decreased safety awareness;Decreased knowledge of precautions          PT Treatment Interventions DME instruction;Gait training;Stair training;Functional mobility training;Therapeutic activities;Therapeutic exercise;Balance training;Neuromuscular re-education;Cognitive remediation;Patient/family education    PT Goals (Current goals can be found in the Care Plan section)   Acute Rehab PT Goals Patient Stated Goal: Pt wants to be independent again.  PT Goal Formulation: With patient/family Time For Goal Achievement: 10/17/16 Potential to Achieve Goals: Good    Frequency Min 6X/week   Barriers to discharge Decreased caregiver support lives alone    Co-evaluation               End of Session Equipment Utilized During Treatment: Gait belt Activity Tolerance: Patient tolerated treatment well Patient left: in chair;with call bell/phone within reach;with family/visitor present Nurse Communication: Mobility status Misty Stanley(Lisa, RN notified of pt's mobiltiy and need for assistance during mobiltiy due to high fall risk.  )    Functional Assessment Tool Used: [employer] AM-PAC "6-clicks"  Functional Limitation: Mobility: Walking and moving around Mobility: Walking and Moving Around Current Status [phone]): At least 40 percent but less than 60 percent impaired, limited or restricted Mobility: Walking and Moving Around Goal Status [phone]): At least 20 percent but less than 40 percent impaired, limited or restricted    Time: 1015-1053 PT Time Calculation (min) (ACUTE ONLY): 38 min   Charges:   PT Evaluation $PT Eval Low Complexity: 1 Procedure PT Treatments $Gait Training: 8-22  mins $Therapeutic Activity: 8-22 mins   PT G Codes:   PT G-Codes **NOT FOR INPATIENT CLASS** Functional Assessment Tool Used: [employer] "6-clicks"  Functional Limitation: Mobility: Walking and moving around Mobility: Walking and Moving Around Current Status [phone]): At least 40 percent but less than 60 percent impaired, limited or restricted Mobility: Walking and Moving Around Goal Status [phone]): At least 20 percent but less than 40 percent impaired, limited or restricted    Beth Licet Dunphy, PT, DPT X: [phone]

## 2016-10-11 ENCOUNTER — Other Ambulatory Visit (HOSPITAL_COMMUNITY): Payer: Commercial Managed Care - HMO

## 2016-10-11 ENCOUNTER — Inpatient Hospital Stay
Admission: RE | Admit: 2016-10-11 | Discharge: 2016-10-31 | Disposition: A | Discharge status: Home / Self Care | Payer: Commercial Managed Care - HMO | Source: Ambulatory Visit | Attending: Pulmonary Disease | Admitting: Pulmonary Disease

## 2016-10-11 DIAGNOSIS — F411 Generalized anxiety disorder: Secondary | ICD-10-CM | POA: Diagnosis present

## 2016-10-11 DIAGNOSIS — E785 Hyperlipidemia, unspecified: Secondary | ICD-10-CM | POA: Diagnosis present

## 2016-10-11 DIAGNOSIS — K219 Gastro-esophageal reflux disease without esophagitis: Secondary | ICD-10-CM | POA: Diagnosis present

## 2016-10-11 DIAGNOSIS — H409 Unspecified glaucoma: Secondary | ICD-10-CM | POA: Diagnosis present

## 2016-10-11 DIAGNOSIS — G8191 Hemiplegia, unspecified affecting right dominant side: Secondary | ICD-10-CM

## 2016-10-11 LAB — HEMOGLOBIN A1C
HEMOGLOBIN A1C: 6.6 % — AB (ref 4.8–5.6)
MEAN PLASMA GLUCOSE: 143 mg/dL

## 2016-10-11 MED ORDER — SENNOSIDES-DOCUSATE SODIUM 8.6-50 MG PO TABS: 1.0000 | ORAL_TABLET | Freq: Every evening | ORAL | Status: AC | PRN

## 2016-10-11 MED ORDER — ASPIRIN 325 MG PO TABS: 325.0000 mg | ORAL_TABLET | Freq: Every day | ORAL | 12 refills | Status: DC

## 2016-10-11 MED ORDER — ATORVASTATIN CALCIUM 40 MG PO TABS: 40.0000 mg | ORAL_TABLET | Freq: Every day | ORAL | 12 refills | Status: AC

## 2016-10-11 MED ORDER — PANTOPRAZOLE SODIUM 40 MG PO TBEC: 40.0000 mg | DELAYED_RELEASE_TABLET | Freq: Every day | ORAL | Status: AC

## 2016-10-11 NOTE — Progress Notes (Signed)
Spoke with social work about patient [facility] today.  [facility] and call [facility] back when a bed with available.

## 2016-10-11 NOTE — Progress Notes (Signed)
Called report to Kathie RhodesBetty, nurse at [facility]. Will transport patient in about 30 minutes with belongings, IV removed, and with prescriptions.

## 2016-10-11 NOTE — Discharge Summary (Signed)
Physician Discharge Summary  Patient ID: Krista Munoz MRN: [id number] DOB/AGE: 572/08/1931 82 y.o. Primary Care Physician:Emile Ringgenberg L, MD Admit date: 10/09/2016 Discharge date: 10/11/2016    Discharge Diagnoses:   Principal Problem:   Acute CVA (cerebrovascular accident) [facility](HCC) Active Problems:   HTN (hypertension) Glaucoma Hyperlipidemia Right hemiparesis Generalized anxiety GERD    Medication List    STOP taking these medications   lansoprazole 15 MG capsule Commonly known as:  PREVACID Replaced by:  pantoprazole 40 MG tablet   Menthol (Topical Analgesic) 4 % Gel   OMEPRAZOLE PO     TAKE these medications   acetaminophen 325 MG tablet Commonly known as:  TYLENOL Take 325 mg by mouth every 6 (six) hours as needed for pain.   ALPRAZolam 0.5 MG tablet Commonly known as:  XANAX Take 0.5 mg by mouth 3 (three) times daily as needed.   aspirin 325 MG tablet Take 1 tablet (325 mg total) by mouth daily. Start taking on:  10/12/2016   atorvastatin 40 MG tablet Commonly known as:  LIPITOR Take 1 tablet (40 mg total) by mouth daily at 6 PM.   bimatoprost 0.03 % ophthalmic solution Commonly known as:  LUMIGAN Place 1 drop into the left eye at bedtime.   brimonidine 0.1 % Soln Commonly known as:  ALPHAGAN P Place 1 drop into both eyes 2 (two) times daily.   CENTRUM SILVER PO Take 1 tablet by mouth daily.   dorzolamide-timolol 22.3-6.8 MG/ML ophthalmic solution Commonly known as:  COSOPT Place 1 drop into both eyes 2 (two) times daily.   FISH OIL PO Take 1 capsule by mouth daily.   loratadine 10 MG tablet Commonly known as:  CLARITIN Take 10 mg by mouth daily.   meclizine 25 MG tablet Commonly known as:  ANTIVERT Take 25 mg by mouth 3 (three) times daily as needed.   pantoprazole 40 MG tablet Commonly known as:  PROTONIX Take 1 tablet (40 mg total) by mouth daily. Start taking on:  10/12/2016 Replaces:  lansoprazole 15 MG capsule    senna-docusate 8.6-50 MG tablet Commonly known as:  Senokot-S Take 1 tablet by mouth at bedtime as needed for mild constipation.   valsartan 160 MG tablet Commonly known as:  DIOVAN Take 160 mg by mouth daily.       Discharged Condition:Improved    Consults: Neurology, speech, PT, OT  Significant Diagnostic Studies: Ct Head Wo Contrast  Result Date: 10/09/2016 CLINICAL DATA:  Initial evaluation for possible stroke. Slurred speech with right-sided weakness. EXAM: CT HEAD WITHOUT CONTRAST TECHNIQUE: Contiguous axial images were obtained from the base of the skull through the vertex without intravenous contrast. COMPARISON:  Comparison made with prior head CT from 10/26/2013. FINDINGS: Brain: Mild diffuse prominence of the CSF containing spaces is compatible with generalized cerebral atrophy. Patchy and confluent hypodensity within the periventricular and deep white matter both cerebral hemispheres most consistent with chronic microvascular ischemic changes. These changes have mildly progressed relative to 2014. There is a somewhat age indeterminate hypodensity within the posterior limb of the left internal capsule measuring 11 mm (series 5, image 31). Finding is somewhat age indeterminate, but could be acute/subacute in nature. No associated hemorrhage or mass effect. No evidence for acute intracranial hemorrhage. Gray-white matter differentiation otherwise maintained without evidence for acute large vessel territory infarct. Deep gray nuclei maintained. No mass lesion, midline shift or mass effect. No hydrocephalus. No extra-axial fluid collection. Vascular: No hyperdense vessel identified. Scattered vascular calcifications present within the carotid siphons.  Skull: Scalp soft tissues demonstrate no acute abnormality. Calvarium intact. Sinuses/Orbits: Visualized globes and orbits within normal limits. Visualized paranasal sinuses are clear. No mastoid effusion. IMPRESSION: 1. 11 mm hypodensity  within the posterior limb of the left internal capsule, somewhat age indeterminate, but could reflect an acute/subacute ischemic infarct. This could be further assessed with dedicated MRI as clinically indicated. No associated hemorrhage or mass effect. 2. No other acute intracranial process identified. 3. Mild age-related cerebral atrophy with chronic small vessel ischemic disease, progressed relative to most recent CT from 2014. Results were called by telephone at the time of interpretation on 10/09/2016 at 5:21 pm to Dr. Kari Baars , who verbally acknowledged these results. Electronically Signed   By: Rise Mu M.D.   On: 10/09/2016 17:26   Mr Brain Wo Contrast (neuro Protocol)  Result Date: 10/09/2016 CLINICAL DATA:  Initial evaluation for acute stroke. EXAM: MRI HEAD WITHOUT CONTRAST TECHNIQUE: Multiplanar, multiecho pulse sequences of the brain and surrounding structures were obtained without intravenous contrast. COMPARISON:  Prior CT from earlier the same day. FINDINGS: Brain: Study is limited as the patient was unable to tolerate the full length of the exam. Additionally, the images provided are degraded by motion artifact. Diffusion-weighted sequences demonstrate abnormal restricted diffusion involving the posterior limb of the left internal capsule/left thalamus, compatible with acute ischemic infarct. This confirms the finding on prior CT. Infarct measures 13 x 10 x 13 mm. No associated mass effect. No findings to suggest hemorrhage. No other evidence for acute ischemia. No definite mass lesion. No midline shift or mass effect. Ventricles normal in size without hydrocephalus. No extra-axial fluid collection. Pituitary gland and suprasellar region normal. Midline structures intact. Vascular: Major intracranial vascular flow voids grossly maintained. Skull and upper cervical spine: Craniocervical junction normal. Visualized upper cervical spine unremarkable without significant  degenerative changes are stenosis. Bone marrow signal intensity within normal limits. No scalp soft tissue abnormality. Sinuses/Orbits: Globes and orbital soft tissues grossly normal. Patient appears to be status post lens extraction bilaterally. Paranasal sinuses are clear. No mastoid effusion. IMPRESSION: 1. 13 x 10 x 13 mm acute ischemic lacunar infarct involving the posterior limb of the left internal capsule. The previously questioned abnormality on prior CT is confirmed. 2. No other definite acute intracranial process on this limited motion degraded study. Electronically Signed   By: Rise Mu M.D.   On: 10/09/2016 20:31   US Carotid Bilateral (at [facility] Only)  Result Date: 10/10/2016 CLINICAL DATA:  Small acute infarct in the left posterior limb of the external capsule. EXAM: BILATERAL CAROTID DUPLEX ULTRASOUND TECHNIQUE: Wallace Cullens scale imaging, color Doppler and duplex ultrasound were performed of bilateral carotid and vertebral arteries in the neck. COMPARISON:  10/09/2016 CT and MRI FINDINGS: Criteria: Quantification of carotid stenosis is based on velocity parameters that correlate the residual internal carotid diameter with NASCET-based stenosis levels, using the diameter of the distal internal carotid lumen as the denominator for stenosis measurement. The following velocity measurements were obtained: RIGHT ICA:  83/20 cm/sec CCA:  98/20 cm/sec SYSTOLIC ICA/CCA RATIO:  0.9 DIASTOLIC ICA/CCA RATIO:  1.0 ECA:  98 cm/sec LEFT ICA:  93/24 cm/sec CCA:  117/22 cm/sec SYSTOLIC ICA/CCA RATIO:  0.8 DIASTOLIC ICA/CCA RATIO:  1.1 ECA:  101 cm/sec RIGHT CAROTID ARTERY: Minor intimal thickening. No significant plaque Formation. Tortuous carotid system. No hemodynamically significant right ICA stenosis, velocity elevation, or turbulent flow. Degree of narrowing less than 50%. RIGHT VERTEBRAL ARTERY:  Antegrade LEFT CAROTID ARTERY: Similar scattered  minor intimal thickening. No significant plaque  formation. Torturous carotid system. No hemodynamically significant left ICA stenosis, velocity elevation, or turbulent flow. LEFT VERTEBRAL ARTERY:  Antegrade IMPRESSION: Minor carotid intimal thickening without significant atherosclerosis. No hemodynamically significant ICA stenosis. Degree of narrowing less than 50% bilaterally. Patent antegrade vertebral flow bilaterally Electronically Signed   By: Judie PetitM.  Shick M.D.   On: 10/10/2016 09:40    Lab Results: Basic Metabolic Panel:  Recent Labs  19/14/7810/08/17 1930 10/09/16 1943  NA 136 141  K 3.2* 4.0  CL 104 104  CO2 23  --   GLUCOSE 118* 117*  BUN 10 11  CREATININE 0.74 0.70  CALCIUM 9.1  --    Liver Function Tests:  Recent Labs  10/09/16 1930  AST 20  ALT 14  ALKPHOS 84  BILITOT 0.5  PROT 7.7  ALBUMIN 3.9     CBC:  Recent Labs  10/09/16 1930 10/09/16 1943  WBC 8.8  --   NEUTROABS 5.2  --   HGB 13.6 14.6  HCT 40.4 43.0  MCV 91.0  --   PLT 267  --     No results found for this or any previous visit (from the past 240 hour(s)).   Hospital Course: This is an 82 year old came to my office with weakness on the right side and dizziness. I was concerned that she had an acute stroke and send her for CT of the brain which didn't show subacute stroke. She been having symptoms for about 48 hours. She was sent to the emergency department and admitted from there. She has right hemiparesis. She had neurology consultation and recommendations were followed. She had lipid panel and needs to be on statin and that was started. She's been started on aspirin. She has started PT OT and speech therapy. It is felt that she is going to need skilled care facility placement and that is underway.  Discharge Exam: Blood pressure (!) 132/52, pulse 70, temperature 97.7 F (36.5 C), temperature source Oral, resp. rate 18, height 5\' 2"  (1.575 m), weight 64.9 kg (143 lb 1.6 oz), SpO2 100 %. Still right hemiparesis. Chest is clear. Heart is  regular.  Disposition: To skilled care facility when bed available      Signed: Janae Bonser L   10/11/2016, 10:13 AM

## 2016-10-11 NOTE — Progress Notes (Signed)
Subjective: She feels better. She has a little more use of her arm. No abdominal pain nausea or vomiting.  Objective: Vital signs in last 24 hours: Temp:  [97.6 F (36.4 C)-98.4 F (36.9 C)] 97.7 F (36.5 C) (11/11 0900) Pulse Rate:  [70-93] 70 (11/11 0900) Resp:  [18-20] 18 (11/11 0900) BP: (109-153)/(50-74) 132/52 (11/11 0900) SpO2:  [95 %-100 %] 100 % (11/11 0900) Weight change:  Last BM Date: 10/09/16  Intake/Output from previous day: 11/10 0701 - 11/11 0700 In: 8119 [P.O.:720; I.V.:500] Out: 920 [Urine:920]  PHYSICAL EXAM General appearance: alert, cooperative and no distress Resp: clear to auscultation bilaterally Cardio: regular rate and rhythm, S1, S2 normal, no murmur, click, rub or gallop GI: soft, non-tender; bowel sounds normal; no masses,  no organomegaly Extremities: extremities normal, atraumatic, no cyanosis or edema Right hemiparesis is mildly better. She has more use of her right arm. Skin warm and dry. Mucous membranes are moist  Lab Results:  Results for orders placed or performed during the hospital encounter of 10/09/16 (from the past 48 hour(s))  Ethanol     Status: None   Collection Time: 10/09/16  7:30 PM  Result Value Ref Range   Alcohol, Ethyl (B) <5 <5 mg/dL    Comment:        LOWEST DETECTABLE LIMIT FOR SERUM ALCOHOL IS 5 mg/dL FOR MEDICAL PURPOSES ONLY   Protime-INR     Status: None   Collection Time: 10/09/16  7:30 PM  Result Value Ref Range   Prothrombin Time 13.0 11.4 - 15.2 seconds   INR 0.99   APTT     Status: None   Collection Time: 10/09/16  7:30 PM  Result Value Ref Range   aPTT 29 24 - 36 seconds  CBC     Status: None   Collection Time: 10/09/16  7:30 PM  Result Value Ref Range   WBC 8.8 4.0 - 10.5 K/uL   RBC 4.44 3.87 - 5.11 MIL/uL   Hemoglobin 13.6 12.0 - 15.0 g/dL   HCT 40.4 36.0 - 46.0 %   MCV 91.0 78.0 - 100.0 fL   MCH 30.6 26.0 - 34.0 pg   MCHC 33.7 30.0 - 36.0 g/dL   RDW 13.4 11.5 - 15.5 %   Platelets 267 150 -  400 K/uL  Differential     Status: None   Collection Time: 10/09/16  7:30 PM  Result Value Ref Range   Neutrophils Relative % 58 %   Neutro Abs 5.2 1.7 - 7.7 K/uL   Lymphocytes Relative 31 %   Lymphs Abs 2.7 0.7 - 4.0 K/uL   Monocytes Relative 8 %   Monocytes Absolute 0.7 0.1 - 1.0 K/uL   Eosinophils Relative 2 %   Eosinophils Absolute 0.2 0.0 - 0.7 K/uL   Basophils Relative 1 %   Basophils Absolute 0.0 0.0 - 0.1 K/uL  Comprehensive metabolic panel     Status: Abnormal   Collection Time: 10/09/16  7:30 PM  Result Value Ref Range   Sodium 136 135 - 145 mmol/L   Potassium 3.2 (L) 3.5 - 5.1 mmol/L   Chloride 104 101 - 111 mmol/L   CO2 23 22 - 32 mmol/L   Glucose, Bld 118 (H) 65 - 99 mg/dL   BUN 10 6 - 20 mg/dL   Creatinine, Ser 0.74 0.44 - 1.00 mg/dL   Calcium 9.1 8.9 - 10.3 mg/dL   Total Protein 7.7 6.5 - 8.1 g/dL   Albumin 3.9 3.5 - 5.0 g/dL  AST 20 15 - 41 U/L   ALT 14 14 - 54 U/L   Alkaline Phosphatase 84 38 - 126 U/L   Total Bilirubin 0.5 0.3 - 1.2 mg/dL   GFR calc non Af Amer >60 >60 mL/min   GFR calc Af Amer >60 >60 mL/min    Comment: (NOTE) The eGFR has been calculated using the CKD EPI equation. This calculation has not been validated in all clinical situations. eGFR's persistently <60 mL/min signify possible Chronic Kidney Disease.    Anion gap 9 5 - 15  Hemoglobin A1c     Status: Abnormal   Collection Time: 10/09/16  7:30 PM  Result Value Ref Range   Hgb A1c MFr Bld 6.6 (H) 4.8 - 5.6 %    Comment: (NOTE)         Pre-diabetes: 5.7 - 6.4         Diabetes: >6.4         Glycemic control for adults with diabetes: <7.0    Mean Plasma Glucose 143 mg/dL    Comment: (NOTE) Performed At: [facility] [place], [place] [id number] Lindon Romp MD BM:[id number]   I-Stat Chem 8, ED  (not at [facility], [facility])     Status: Abnormal   Collection Time: 10/09/16  7:43 PM  Result Value Ref Range   Sodium 141 135 - 145 mmol/L   Potassium 4.0 3.5 - 5.1  mmol/L   Chloride 104 101 - 111 mmol/L   BUN 11 6 - 20 mg/dL   Creatinine, Ser 0.70 0.44 - 1.00 mg/dL   Glucose, Bld 117 (H) 65 - 99 mg/dL   Calcium, Ion 1.12 (L) 1.15 - 1.40 mmol/L   TCO2 25 0 - 100 mmol/L   Hemoglobin 14.6 12.0 - 15.0 g/dL   HCT 43.0 36.0 - 46.0 %  I-stat troponin, ED (not at [facility], [facility])     Status: None   Collection Time: 10/09/16  7:43 PM  Result Value Ref Range   Troponin i, poc 0.01 0.00 - 0.08 ng/mL   Comment 3            Comment: Due to the release kinetics of cTnI, a negative result within the first hours of the onset of symptoms does not rule out myocardial infarction with certainty. If myocardial infarction is still suspected, repeat the test at appropriate intervals.   Urinalysis, Routine w reflex microscopic (not at [facility])     Status: Abnormal   Collection Time: 10/10/16  5:00 AM  Result Value Ref Range   Color, Urine YELLOW YELLOW   APPearance CLEAR CLEAR   Specific Gravity, Urine >1.030 (H) 1.005 - 1.030   pH 5.5 5.0 - 8.0   Glucose, UA NEGATIVE NEGATIVE mg/dL   Hgb urine dipstick NEGATIVE NEGATIVE   Bilirubin Urine SMALL (A) NEGATIVE   Ketones, ur 40 (A) NEGATIVE mg/dL   Protein, ur TRACE (A) NEGATIVE mg/dL   Nitrite NEGATIVE NEGATIVE   Leukocytes, UA SMALL (A) NEGATIVE  Urine microscopic-add on     Status: Abnormal   Collection Time: 10/10/16  5:00 AM  Result Value Ref Range   Squamous Epithelial / LPF TOO NUMEROUS TO COUNT (A) NONE SEEN   WBC, UA TOO NUMEROUS TO COUNT 0 - 5 WBC/hpf   RBC / HPF NONE SEEN 0 - 5 RBC/hpf   Bacteria, UA MANY (A) NONE SEEN  Lipid panel     Status: Abnormal   Collection Time: 10/10/16  6:17 AM  Result Value  Ref Range   Cholesterol 178 0 - 200 mg/dL   Triglycerides 64 <150 mg/dL   HDL 51 >40 mg/dL   Total CHOL/HDL Ratio 3.5 RATIO   VLDL 13 0 - 40 mg/dL   LDL Cholesterol 114 (H) 0 - 99 mg/dL    Comment:        Total Cholesterol/HDL:CHD Risk Coronary Heart Disease Risk Table                     Men    Women  1/2 Average Risk   3.4   3.3  Average Risk       5.0   4.4  2 X Average Risk   9.6   7.1  3 X Average Risk  23.4   11.0        Use the calculated Patient Ratio above and the CHD Risk Table to determine the patient's CHD Risk.        ATP III CLASSIFICATION (LDL):  <100     mg/dL   Optimal  100-129  mg/dL   Near or Above                    Optimal  130-159  mg/dL   Borderline  160-189  mg/dL   High  >190     mg/dL   Very High     ABGS  Recent Labs  10/09/16 1943  TCO2 25   CULTURES No results found for this or any previous visit (from the past 240 hour(s)). Studies/Results: Ct Head Wo Contrast  Result Date: 10/09/2016 CLINICAL DATA:  Initial evaluation for possible stroke. Slurred speech with right-sided weakness. EXAM: CT HEAD WITHOUT CONTRAST TECHNIQUE: Contiguous axial images were obtained from the base of the skull through the vertex without intravenous contrast. COMPARISON:  Comparison made with prior head CT from 10/26/2013. FINDINGS: Brain: Mild diffuse prominence of the CSF containing spaces is compatible with generalized cerebral atrophy. Patchy and confluent hypodensity within the periventricular and deep white matter both cerebral hemispheres most consistent with chronic microvascular ischemic changes. These changes have mildly progressed relative to 2014. There is a somewhat age indeterminate hypodensity within the posterior limb of the left internal capsule measuring 11 mm (series 5, image 31). Finding is somewhat age indeterminate, but could be acute/subacute in nature. No associated hemorrhage or mass effect. No evidence for acute intracranial hemorrhage. Gray-white matter differentiation otherwise maintained without evidence for acute large vessel territory infarct. Deep gray nuclei maintained. No mass lesion, midline shift or mass effect. No hydrocephalus. No extra-axial fluid collection. Vascular: No hyperdense vessel identified. Scattered vascular  calcifications present within the carotid siphons. Skull: Scalp soft tissues demonstrate no acute abnormality. Calvarium intact. Sinuses/Orbits: Visualized globes and orbits within normal limits. Visualized paranasal sinuses are clear. No mastoid effusion. IMPRESSION: 1. 11 mm hypodensity within the posterior limb of the left internal capsule, somewhat age indeterminate, but could reflect an acute/subacute ischemic infarct. This could be further assessed with dedicated MRI as clinically indicated. No associated hemorrhage or mass effect. 2. No other acute intracranial process identified. 3. Mild age-related cerebral atrophy with chronic small vessel ischemic disease, progressed relative to most recent CT from 2014. Results were called by telephone at the time of interpretation on 10/09/2016 at 5:21 pm to Dr. Sinda Du , who verbally acknowledged these results. Electronically Signed   By: Jeannine Boga M.D.   On: 10/09/2016 17:26   Mr Brain Wo Contrast (neuro Protocol)  Result Date: 10/09/2016  CLINICAL DATA:  Initial evaluation for acute stroke. EXAM: MRI HEAD WITHOUT CONTRAST TECHNIQUE: Multiplanar, multiecho pulse sequences of the brain and surrounding structures were obtained without intravenous contrast. COMPARISON:  Prior CT from earlier the same day. FINDINGS: Brain: Study is limited as the patient was unable to tolerate the full length of the exam. Additionally, the images provided are degraded by motion artifact. Diffusion-weighted sequences demonstrate abnormal restricted diffusion involving the posterior limb of the left internal capsule/left thalamus, compatible with acute ischemic infarct. This confirms the finding on prior CT. Infarct measures 13 x 10 x 13 mm. No associated mass effect. No findings to suggest hemorrhage. No other evidence for acute ischemia. No definite mass lesion. No midline shift or mass effect. Ventricles normal in size without hydrocephalus. No extra-axial fluid  collection. Pituitary gland and suprasellar region normal. Midline structures intact. Vascular: Major intracranial vascular flow voids grossly maintained. Skull and upper cervical spine: Craniocervical junction normal. Visualized upper cervical spine unremarkable without significant degenerative changes are stenosis. Bone marrow signal intensity within normal limits. No scalp soft tissue abnormality. Sinuses/Orbits: Globes and orbital soft tissues grossly normal. Patient appears to be status post lens extraction bilaterally. Paranasal sinuses are clear. No mastoid effusion. IMPRESSION: 1. 13 x 10 x 13 mm acute ischemic lacunar infarct involving the posterior limb of the left internal capsule. The previously questioned abnormality on prior CT is confirmed. 2. No other definite acute intracranial process on this limited motion degraded study. Electronically Signed   By: Jeannine Boga M.D.   On: 10/09/2016 20:31   US Carotid Bilateral (at [facility] Only)  Result Date: 10/10/2016 CLINICAL DATA:  Small acute infarct in the left posterior limb of the external capsule. EXAM: BILATERAL CAROTID DUPLEX ULTRASOUND TECHNIQUE: Pearline Cables scale imaging, color Doppler and duplex ultrasound were performed of bilateral carotid and vertebral arteries in the neck. COMPARISON:  10/09/2016 CT and MRI FINDINGS: Criteria: Quantification of carotid stenosis is based on velocity parameters that correlate the residual internal carotid diameter with NASCET-based stenosis levels, using the diameter of the distal internal carotid lumen as the denominator for stenosis measurement. The following velocity measurements were obtained: RIGHT ICA:  83/20 cm/sec CCA:  44/31 cm/sec SYSTOLIC ICA/CCA RATIO:  0.9 DIASTOLIC ICA/CCA RATIO:  1.0 ECA:  98 cm/sec LEFT ICA:  93/24 cm/sec CCA:  540/08 cm/sec SYSTOLIC ICA/CCA RATIO:  0.8 DIASTOLIC ICA/CCA RATIO:  1.1 ECA:  101 cm/sec RIGHT CAROTID ARTERY: Minor intimal thickening. No significant plaque  Formation. Tortuous carotid system. No hemodynamically significant right ICA stenosis, velocity elevation, or turbulent flow. Degree of narrowing less than 50%. RIGHT VERTEBRAL ARTERY:  Antegrade LEFT CAROTID ARTERY: Similar scattered minor intimal thickening. No significant plaque formation. Torturous carotid system. No hemodynamically significant left ICA stenosis, velocity elevation, or turbulent flow. LEFT VERTEBRAL ARTERY:  Antegrade IMPRESSION: Minor carotid intimal thickening without significant atherosclerosis. No hemodynamically significant ICA stenosis. Degree of narrowing less than 50% bilaterally. Patent antegrade vertebral flow bilaterally Electronically Signed   By: Jerilynn Mages.  Shick M.D.   On: 10/10/2016 09:40    Medications:  Prior to Admission:  Prescriptions Prior to Admission  Medication Sig Dispense Refill Last Dose  . Multiple Vitamins-Minerals (CENTRUM SILVER PO) Take 1 tablet by mouth daily.   Past Week at Unknown time  . Omega-3 Fatty Acids (FISH OIL PO) Take 1 capsule by mouth daily.   Past Week at Unknown time  . OMEPRAZOLE PO Take 1 capsule by mouth daily.   10/06/2016  . acetaminophen (TYLENOL) 325  MG tablet Take 325 mg by mouth every 6 (six) hours as needed for pain.   10/08/2016  . ALPRAZolam (XANAX) 0.5 MG tablet Take 0.5 mg by mouth 3 (three) times daily as needed.    10/07/2016  . bimatoprost (LUMIGAN) 0.03 % ophthalmic solution Place 1 drop into the left eye at bedtime.    10/07/2016  . brimonidine (ALPHAGAN P) 0.1 % SOLN Place 1 drop into both eyes 2 (two) times daily.    10/07/2016  . dorzolamide-timolol (COSOPT) 22.3-6.8 MG/ML ophthalmic solution Place 1 drop into both eyes 2 (two) times daily.   10/07/2016  . lansoprazole (PREVACID) 15 MG capsule Take 15 mg by mouth daily.   10/06/2016  . loratadine (CLARITIN) 10 MG tablet Take 10 mg by mouth daily.   10/07/2016  . meclizine (ANTIVERT) 25 MG tablet Take 25 mg by mouth 3 (three) times daily as needed.   unknown  . Menthol,  Topical Analgesic, 4 % GEL Apply to [facility] JOINT THREE TIMES DAILY 1 Tube 5 10/07/2016  . valsartan (DIOVAN) 160 MG tablet Take 160 mg by mouth daily.   10/06/2016   Scheduled: . ALPRAZolam  0.5 mg Oral TID  . aspirin  300 mg Rectal Daily   Or  . aspirin  325 mg Oral Daily  . atorvastatin  40 mg Oral q1800  . brimonidine  1 drop Both Eyes TID  . dorzolamide-timolol  1 drop Both Eyes BID  . heparin  5,000 Units Subcutaneous Q8H  . latanoprost  1 drop Both Eyes QHS  . pantoprazole  40 mg Oral Daily   Continuous: . sodium chloride 50 mL/hr at 10/10/16 0139   UZH:[id number], senna-docusate  Assesment:She was admitted with a stroke. She has right hemiparesis from that. She has hypertension and that's reasonably well controlled she has started back on her meds. She has hyperlipidemia now on atorvastatin. She has glaucoma on multiple drops. She has reflux on pantoprazole. She is felt to need skilled care facility for rehabilitation and I'm going to plan to discharge her for that today. Principal Problem:   Acute CVA (cerebrovascular accident) [facility]) Active Problems:   HTN (hypertension)    Plan: Discharge to skilled care facility    LOS: 1 day   Krista Munoz L 10/11/2016, 10:06 AM

## 2016-10-11 NOTE — Clinical Social Work Note (Signed)
Per MD [facility] RN, patient/family (RN notifying family of transfer at bedside), and facility notified of patient's DC. RN given number for report. Auth received: [id number]. CSW signing off at this time.   Roddie McBryant Larry Knipp MSW, [place] Hills[place]CSW, [place], [phone]

## 2016-10-11 NOTE — Progress Notes (Signed)
CM left message on voicemail to SW regarding patient D/C to [facility] today.

## 2016-10-19 NOTE — H&P (Signed)
Krista FlemingsBetty K Munoz MRN: [id number] DOB/AGE: 06-19-1931 82 y.o. Primary Care Physician:Anupama Piehl L, MD Admit date: 10/11/2016 Chief Complaint: Stroke HPI: This is documentation of my history and physical performed at the skilled care facility on 10/12/2016. She came to the hospital from my office with signs and symptoms of a stroke. This was subacute if she's been having symptoms for about 48 hours. She was not a candidate for any definitive therapy because she was outside the timeframe. After evaluation in the hospital it was felt that she would be best served by going to skilled care facility for rehabilitation and she has agreed to do that. She says she feels a little better. She has a little bit better use of her right arm. She still has some minimal trouble with speech. No chest pain nausea vomiting diarrhea abdominal pain shortness of breath but she does have significant trouble with arthritis in multiple joints.  Past Medical History:  Diagnosis Date  . Allergy   . Anxiety   . Cataract   . Cystocele 09/05/2013  . GERD (gastroesophageal reflux disease)   . Glaucoma   . Hemorrhoids 09/05/2013  . Hypertension   . Rectocele 09/05/2013   Past Surgical History:  Procedure Laterality Date  . EYE SURGERY Left    tube placed and cataract removed  . HIP SURGERY Left 2010    hip replacement.        Family History  Problem Relation Age of Onset  . Cancer Mother   . Heart disease Father   . Heart attack Father   . Cancer Sister [id number]    breast   . Arthritis Daughter   . Asthma Daughter   . Diabetes Daughter   . Diabetes Paternal Grandmother   . Cancer Sister     colon   . Cancer Sister   . Stroke Sister   . Arthritis Sister   . Heart disease Sister   . Kidney disease Sister   . Cancer Sister     breast and lung   . Diabetes Sister   . Vision loss Brother   . Diabetes Brother   . Arthritis Son   . Alcohol abuse Son   . Alcohol abuse Son   . Arthritis Son   . Depression  Son   . Mental illness Son     Social History:  reports that she is a non-smoker but has been exposed to tobacco smoke. She has never used smokeless tobacco. She reports that she does not drink alcohol or use drugs.   Allergies:  Allergies  Allergen Reactions  . Morphine And Related Other (See Comments)    Altered mental status  . Codeine Palpitations    Medications Prior to Admission  Medication Sig Dispense Refill  . acetaminophen (TYLENOL) 325 MG tablet Take 325 mg by mouth every 6 (six) hours as needed for pain.    Marland Kitchen. ALPRAZolam (XANAX) 0.5 MG tablet Take 0.5 mg by mouth 3 (three) times daily as needed.     Marland Kitchen. aspirin 325 MG tablet Take 1 tablet (325 mg total) by mouth daily. 30 tablet 12  . atorvastatin (LIPITOR) 40 MG tablet Take 1 tablet (40 mg total) by mouth daily at 6 PM. 30 tablet 12  . bimatoprost (LUMIGAN) 0.03 % ophthalmic solution Place 1 drop into the left eye at bedtime.     . brimonidine (ALPHAGAN P) 0.1 % SOLN Place 1 drop into both eyes 2 (two) times daily.     . dorzolamide-timolol (COSOPT) 22.3-6.8  MG/ML ophthalmic solution Place 1 drop into both eyes 2 (two) times daily.    Marland Kitchen loratadine (CLARITIN) 10 MG tablet Take 10 mg by mouth daily.    . meclizine (ANTIVERT) 25 MG tablet Take 25 mg by mouth 3 (three) times daily as needed.    . Multiple Vitamins-Minerals (CENTRUM SILVER PO) Take 1 tablet by mouth daily.    . Omega-3 Fatty Acids (FISH OIL PO) Take 1 capsule by mouth daily.    . pantoprazole (PROTONIX) 40 MG tablet Take 1 tablet (40 mg total) by mouth daily.    Marland Kitchen senna-docusate (SENOKOT-S) 8.6-50 MG tablet Take 1 tablet by mouth at bedtime as needed for mild constipation.    . valsartan (DIOVAN) 160 MG tablet Take 160 mg by mouth daily.         ZOX:WRUEA from the symptoms mentioned above,there are no other symptoms referable to all systems reviewed.  Physical Exam: There were no vitals taken for this visit. Constitutional she is awake and alert and in no  acute distress. Eyes: Pupils are reactive EOMI. Ears nose mouth and throat: Her mucous membranes are moist. Her neck is supple. Her throat is clear. Cardiovascular: Her heart is regular with soft systolic murmur. No edema. Respiratory: Normal respiratory effort. Her lungs are clear. Musculoskeletal: She is using a walker for ambulation. She is generally stiff. Neurological: She has right hemiparesis. Psychiatric: She is anxious.   No results for input(s): WBC, NEUTROABS, HGB, HCT, MCV, PLT in the last 72 hours. No results for input(s): NA, K, CL, CO2, GLUCOSE, BUN, CREATININE, CALCIUM, MG in the last 72 hours.  Invalid input(s): PHOlablast2(ast:2,ALT:2,alkphos:2,bilitot:2,prot:2,albumin:2)@    No results found for this or any previous visit (from the past 240 hour(s)).   Ct Head Wo Contrast  Result Date: 10/09/2016 CLINICAL DATA:  Initial evaluation for possible stroke. Slurred speech with right-sided weakness. EXAM: CT HEAD WITHOUT CONTRAST TECHNIQUE: Contiguous axial images were obtained from the base of the skull through the vertex without intravenous contrast. COMPARISON:  Comparison made with prior head CT from 10/26/2013. FINDINGS: Brain: Mild diffuse prominence of the CSF containing spaces is compatible with generalized cerebral atrophy. Patchy and confluent hypodensity within the periventricular and deep white matter both cerebral hemispheres most consistent with chronic microvascular ischemic changes. These changes have mildly progressed relative to 2014. There is a somewhat age indeterminate hypodensity within the posterior limb of the left internal capsule measuring 11 mm (series 5, image 31). Finding is somewhat age indeterminate, but could be acute/subacute in nature. No associated hemorrhage or mass effect. No evidence for acute intracranial hemorrhage. Gray-white matter differentiation otherwise maintained without evidence for acute large vessel territory infarct. Deep gray nuclei  maintained. No mass lesion, midline shift or mass effect. No hydrocephalus. No extra-axial fluid collection. Vascular: No hyperdense vessel identified. Scattered vascular calcifications present within the carotid siphons. Skull: Scalp soft tissues demonstrate no acute abnormality. Calvarium intact. Sinuses/Orbits: Visualized globes and orbits within normal limits. Visualized paranasal sinuses are clear. No mastoid effusion. IMPRESSION: 1. 11 mm hypodensity within the posterior limb of the left internal capsule, somewhat age indeterminate, but could reflect an acute/subacute ischemic infarct. This could be further assessed with dedicated MRI as clinically indicated. No associated hemorrhage or mass effect. 2. No other acute intracranial process identified. 3. Mild age-related cerebral atrophy with chronic small vessel ischemic disease, progressed relative to most recent CT from 2014. Results were called by telephone at the time of interpretation on 10/09/2016 at 5:21 pm to Dr.  Kari BaarsEDWARD Macio Kissoon , who verbally acknowledged these results. Electronically Signed   By: Rise MuBenjamin  McClintock M.D.   On: 10/09/2016 17:26   Mr Brain Wo Contrast (neuro Protocol)  Result Date: 10/09/2016 CLINICAL DATA:  Initial evaluation for acute stroke. EXAM: MRI HEAD WITHOUT CONTRAST TECHNIQUE: Multiplanar, multiecho pulse sequences of the brain and surrounding structures were obtained without intravenous contrast. COMPARISON:  Prior CT from earlier the same day. FINDINGS: Brain: Study is limited as the patient was unable to tolerate the full length of the exam. Additionally, the images provided are degraded by motion artifact. Diffusion-weighted sequences demonstrate abnormal restricted diffusion involving the posterior limb of the left internal capsule/left thalamus, compatible with acute ischemic infarct. This confirms the finding on prior CT. Infarct measures 13 x 10 x 13 mm. No associated mass effect. No findings to suggest hemorrhage.  No other evidence for acute ischemia. No definite mass lesion. No midline shift or mass effect. Ventricles normal in size without hydrocephalus. No extra-axial fluid collection. Pituitary gland and suprasellar region normal. Midline structures intact. Vascular: Major intracranial vascular flow voids grossly maintained. Skull and upper cervical spine: Craniocervical junction normal. Visualized upper cervical spine unremarkable without significant degenerative changes are stenosis. Bone marrow signal intensity within normal limits. No scalp soft tissue abnormality. Sinuses/Orbits: Globes and orbital soft tissues grossly normal. Patient appears to be status post lens extraction bilaterally. Paranasal sinuses are clear. No mastoid effusion. IMPRESSION: 1. 13 x 10 x 13 mm acute ischemic lacunar infarct involving the posterior limb of the left internal capsule. The previously questioned abnormality on prior CT is confirmed. 2. No other definite acute intracranial process on this limited motion degraded study. Electronically Signed   By: Rise MuBenjamin  McClintock M.D.   On: 10/09/2016 20:31   [place] Carotid Bilateral (at [facility] Only)  Result Date: 10/10/2016 CLINICAL DATA:  Small acute infarct in the left posterior limb of the external capsule. EXAM: BILATERAL CAROTID DUPLEX ULTRASOUND TECHNIQUE: Wallace CullensGray scale imaging, color Doppler and duplex ultrasound were performed of bilateral carotid and vertebral arteries in the neck. COMPARISON:  10/09/2016 CT and MRI FINDINGS: Criteria: Quantification of carotid stenosis is based on velocity parameters that correlate the residual internal carotid diameter with NASCET-based stenosis levels, using the diameter of the distal internal carotid lumen as the denominator for stenosis measurement. The following velocity measurements were obtained: RIGHT ICA:  83/20 cm/sec CCA:  98/20 cm/sec SYSTOLIC ICA/CCA RATIO:  0.9 DIASTOLIC ICA/CCA RATIO:  1.0 ECA:  98 cm/sec LEFT ICA:  93/24 cm/sec  CCA:  117/22 cm/sec SYSTOLIC ICA/CCA RATIO:  0.8 DIASTOLIC ICA/CCA RATIO:  1.1 ECA:  101 cm/sec RIGHT CAROTID ARTERY: Minor intimal thickening. No significant plaque Formation. Tortuous carotid system. No hemodynamically significant right ICA stenosis, velocity elevation, or turbulent flow. Degree of narrowing less than 50%. RIGHT VERTEBRAL ARTERY:  Antegrade LEFT CAROTID ARTERY: Similar scattered minor intimal thickening. No significant plaque formation. Torturous carotid system. No hemodynamically significant left ICA stenosis, velocity elevation, or turbulent flow. LEFT VERTEBRAL ARTERY:  Antegrade IMPRESSION: Minor carotid intimal thickening without significant atherosclerosis. No hemodynamically significant ICA stenosis. Degree of narrowing less than 50% bilaterally. Patent antegrade vertebral flow bilaterally Electronically Signed   By: Judie PetitM.  Shick M.D.   On: 10/10/2016 09:40   Impression: She had a stroke. She is undergoing rehabilitation. She has right hemiparesis from that. She has arthritis in multiple areas. She has hypertension and hyperlipidemia both of which are being treated and pretty well controlled. She has significant problems with generalized anxiety  disorder but that's on treatment and although she's anxious it is not providing any barrier to care Active Problems:   * No active hospital problems. *     Plan: 4 rehabilitation for her stroke      Shundra Wirsing L   10/19/2016, 8:52 AM

## 2017-01-19 ENCOUNTER — Ambulatory Visit (INDEPENDENT_AMBULATORY_CARE_PROVIDER_SITE_OTHER): Payer: Medicare HMO | Admitting: Orthopedic Surgery

## 2017-01-19 ENCOUNTER — Ambulatory Visit (HOSPITAL_COMMUNITY)
Admission: RE | Admit: 2017-01-19 | Discharge: 2017-01-19 | Disposition: A | Discharge status: Home / Self Care | Payer: Medicare HMO | Source: Ambulatory Visit | Attending: Orthopedic Surgery | Admitting: Orthopedic Surgery

## 2017-01-19 ENCOUNTER — Encounter: Payer: Self-pay | Admitting: Orthopedic Surgery

## 2017-01-19 VITALS — BP 136/91 | HR 84 | Ht 62.0 in | Wt 150.0 lb

## 2017-01-19 DIAGNOSIS — M25561 Pain in right knee: Secondary | ICD-10-CM

## 2017-01-19 DIAGNOSIS — M25461 Effusion, right knee: Secondary | ICD-10-CM | POA: Insufficient documentation

## 2017-01-19 DIAGNOSIS — M1711 Unilateral primary osteoarthritis, right knee: Secondary | ICD-10-CM

## 2017-01-19 DIAGNOSIS — I70209 Unspecified atherosclerosis of native arteries of extremities, unspecified extremity: Secondary | ICD-10-CM | POA: Insufficient documentation

## 2017-01-19 NOTE — Progress Notes (Signed)
Patient ID: Krista Munoz, female   DOB: September 08, 1931, 83 y.o.   MRN: [id number]  Chief Complaint  Patient presents with  . Knee Pain    Right knee pain, [facility]. Referred by Dr. Juanetta GoslingHawkins for consult and treat.    HPI Krista FlemingsBetty K Schroeck is a 83 y.o. female.  Recent stroke in November had therapy complains of medial right moderate aching knee pain, giving way right leg, decreased range of motion, prior to stroke had same symptoms and difficulty going up steps  Review of Systems Review of Systems  Constitutional: Negative for fever.  Musculoskeletal: Positive for back pain and gait problem.   (2 MINIMUM)  Past Medical History:  Diagnosis Date  . Allergy   . Anxiety   . Cataract   . Cystocele 09/05/2013  . GERD (gastroesophageal reflux disease)   . Glaucoma   . Hemorrhoids 09/05/2013  . Hypertension   . Rectocele 09/05/2013    Past Surgical History:  Procedure Laterality Date  . EYE SURGERY Left    tube placed and cataract removed  . HIP SURGERY Left 2010    hip replacement.    Social History Social History  Substance Use Topics  . Smoking status: Passive Smoke Exposure - Never Smoker  . Smokeless tobacco: Never Used  . Alcohol use No    Allergies  Allergen Reactions  . Morphine And Related Other (See Comments)    Altered mental status  . Codeine Palpitations    No outpatient prescriptions have been marked as taking for the 01/19/17 encounter (Office Visit) with Vickki HearingStanley E Miu Chiong, MD.      Physical Exam Physical Exam 1.BP (!) 136/91   Pulse 84   Ht 5\' 2"  (1.575 m)   Wt 150 lb (68 kg)   BMI 27.44 kg/m   2. Gen. appearance. The patient is well-developed and well-nourished, grooming and hygiene are normal. There are no gross congenital abnormalities  3. The patient is alert and oriented to person place and time  4. Mood and affect are normal  5. Ambulation She requires a walker  Examination reveals the following: 6. On inspection we find  swollen right knee compared to left medial joint line tenderness mild varus alignment  7. With the range of motion of  5-125  8. Stability tests were normal  in all her ligaments  9. Strength tests revealed grade 5 motor strength  10. Skin we find no rash ulceration or erythema  11. Sensation remains intact  12 Vascular system shows no peripheral edema  Left knee was not swollen came to full extension no tenderness  MEDICAL DECISION MAKING:    Data Reviewed X-rays done at the hospital  There is a small effusion in the right knee she has grade 3 arthritis of the medial compartment  Assessment Primary osteoarthritis right knee in the setting of CVA within the last 6 months recommend injection right knee joint  Plan Inject rt knee joint   Procedure note right knee injection verbal consent was obtained to inject right knee joint  Timeout was completed to confirm the site of injection  The medications used were 40 mg of Depo-Medrol and 1% lidocaine 3 cc  Anesthesia was provided by ethyl chloride and the skin was prepped with alcohol.  After cleaning the skin with alcohol a 20-gauge needle was used to inject the right knee joint. There were no complications. A sterile bandage was applied.   Fuller CanadaStanley Rayssa Atha 01/19/2017, 4:22 PM

## 2017-11-19 ENCOUNTER — Telehealth: Payer: Self-pay | Admitting: Radiology

## 2017-11-19 MED ORDER — CEPHALEXIN 500 MG PO CAPS: 2000.0000 mg | ORAL_CAPSULE | Freq: Once | ORAL | 0 refills | Status: DC

## 2017-11-19 NOTE — Telephone Encounter (Signed)
Dentist office called, patient needs premed for Dental work  Keflex 2000mg  once one hour prior to dental work sent in per protocol.    Have sent note to dentist office, they will continue this protocol from now on.   Fax [phone]

## 2017-12-25 ENCOUNTER — Other Ambulatory Visit: Payer: Self-pay | Admitting: Orthopedic Surgery

## 2017-12-28 ENCOUNTER — Other Ambulatory Visit: Payer: Self-pay | Admitting: Orthopedic Surgery

## 2017-12-28 NOTE — Telephone Encounter (Signed)
Patient states that in December she got 4 Keflex tablets  Prior to having a tooth pulled.  Now she is having her teeth cleaned next week and requests another prescription for   Keflex 500 mgs.  Sig: Take 4 capsules (2,000 mg total) by mouth once for 1 dose.  [facility]

## 2018-02-16 ENCOUNTER — Telehealth: Payer: Self-pay | Admitting: Orthopedic Surgery

## 2018-02-16 MED ORDER — CEPHALEXIN 500 MG PO CAPS: ORAL_CAPSULE | ORAL | 2 refills | Status: DC

## 2018-02-16 NOTE — Telephone Encounter (Signed)
Patient is requesting refill on anti-biotics as previously ordered, states has appointment for tooth pulling, on Monday, 02/22/18, with Reginia FortsAbbey Hawkins, DDS, [place] - office ph# [phone]; [facility] [place]

## 2018-02-16 NOTE — Telephone Encounter (Signed)
Refilled per protocol.

## 2018-05-11 ENCOUNTER — Other Ambulatory Visit (HOSPITAL_COMMUNITY): Payer: Self-pay | Admitting: Pulmonary Disease

## 2018-05-11 ENCOUNTER — Ambulatory Visit (HOSPITAL_COMMUNITY)
Admission: RE | Admit: 2018-05-11 | Discharge: 2018-05-11 | Disposition: A | Discharge status: Home / Self Care | Payer: Medicare HMO | Source: Ambulatory Visit | Attending: Pulmonary Disease | Admitting: Pulmonary Disease

## 2018-05-11 DIAGNOSIS — M542 Cervicalgia: Secondary | ICD-10-CM | POA: Diagnosis not present

## 2018-12-07 ENCOUNTER — Other Ambulatory Visit (HOSPITAL_COMMUNITY): Payer: Self-pay | Admitting: Pulmonary Disease

## 2018-12-07 ENCOUNTER — Ambulatory Visit (HOSPITAL_COMMUNITY)
Admission: RE | Admit: 2018-12-07 | Discharge: 2018-12-07 | Disposition: A | Discharge status: Home / Self Care | Payer: Medicare HMO | Source: Ambulatory Visit | Attending: Pulmonary Disease | Admitting: Pulmonary Disease

## 2018-12-07 DIAGNOSIS — M25562 Pain in left knee: Secondary | ICD-10-CM | POA: Insufficient documentation

## 2018-12-07 DIAGNOSIS — M25561 Pain in right knee: Secondary | ICD-10-CM

## 2019-09-22 ENCOUNTER — Other Ambulatory Visit (HOSPITAL_COMMUNITY): Payer: Self-pay | Admitting: Pulmonary Disease

## 2019-09-22 ENCOUNTER — Encounter (HOSPITAL_COMMUNITY): Payer: Self-pay

## 2019-09-22 ENCOUNTER — Ambulatory Visit (HOSPITAL_COMMUNITY)
Admission: RE | Admit: 2019-09-22 | Discharge: 2019-09-22 | Disposition: A | Discharge status: Home / Self Care | Payer: Medicare HMO | Source: Ambulatory Visit | Attending: Pulmonary Disease | Admitting: Pulmonary Disease

## 2019-09-22 ENCOUNTER — Other Ambulatory Visit: Payer: Self-pay

## 2019-09-22 DIAGNOSIS — M545 Low back pain, unspecified: Secondary | ICD-10-CM

## 2019-09-22 DIAGNOSIS — M25511 Pain in right shoulder: Secondary | ICD-10-CM | POA: Insufficient documentation

## 2019-09-22 DIAGNOSIS — M25561 Pain in right knee: Secondary | ICD-10-CM | POA: Diagnosis present

## 2019-09-22 DIAGNOSIS — M25551 Pain in right hip: Secondary | ICD-10-CM

## 2019-10-13 ENCOUNTER — Encounter (HOSPITAL_COMMUNITY): Payer: Self-pay

## 2019-10-13 ENCOUNTER — Ambulatory Visit (HOSPITAL_COMMUNITY): Payer: Medicare HMO | Admitting: Physical Therapy

## 2019-10-13 ENCOUNTER — Telehealth (HOSPITAL_COMMUNITY): Payer: Self-pay | Admitting: Physical Therapy

## 2019-10-13 NOTE — Telephone Encounter (Signed)
pt cancelled due to the weather 

## 2019-10-20 ENCOUNTER — Ambulatory Visit (HOSPITAL_COMMUNITY): Payer: Medicare HMO | Attending: Pulmonary Disease | Admitting: Physical Therapy

## 2019-10-20 ENCOUNTER — Other Ambulatory Visit: Payer: Self-pay

## 2019-10-20 DIAGNOSIS — R262 Difficulty in walking, not elsewhere classified: Secondary | ICD-10-CM | POA: Diagnosis present

## 2019-10-20 DIAGNOSIS — M6281 Muscle weakness (generalized): Secondary | ICD-10-CM | POA: Diagnosis not present

## 2019-10-20 NOTE — Therapy (Signed)
[facility] [address] [place], [place], [address] Phone: [phone]   Fax:  [phone]  Physical Therapy Evaluation  Patient Details  Name: Krista Munoz MRN: [id number] Date of Birth: 04/01/1931 Referring Provider (PT): Kari Baars   Encounter Date: 10/20/2019  PT End of Session - 10/20/19 1632    Visit Number  1    Number of Visits  8    Authorization Type  aetna Medicare, med necessity, no auth req.    Authorization Time Period  POC dates 10/20/19 to 12/15/2019    Authorization - Visit Number  1    Authorization - Number of Visits  8    PT Start Time  1615    PT Stop Time  1655    PT Time Calculation (min)  40 min    Equipment Utilized During Treatment  Gait belt    Activity Tolerance  Patient limited by fatigue;Patient limited by pain    Behavior During Therapy  WFL for tasks assessed/performed       Past Medical History:  Diagnosis Date  . Allergy   . Anxiety   . Cataract   . Cystocele 09/05/2013  . GERD (gastroesophageal reflux disease)   . Glaucoma   . Hemorrhoids 09/05/2013  . Hypertension   . Rectocele 09/05/2013    Past Surgical History:  Procedure Laterality Date  . EYE SURGERY Left    tube placed and cataract removed  . HIP SURGERY Left 2010    hip replacement.    There were no vitals filed for this visit.   Subjective Assessment - 10/20/19 1624    Subjective  Patient complains of pain along right side of body ever since she had a stroke in 2017. States that she had difficulties walking before her stroke but it has been worse since then. States she uses a cane out unless she is going to be walking long distances, then she uses a rollator. States that she likes the walker but that it is heavy to get in and out of the car. States that she hasn't fallen but that she does use furniture and the wall a low when she is walking and feels unsteady on her feet. States her arm is weak and her leg is also always  heavy, weak and numb. States her copay is a lot for her and she would like to develop a home exercise program that she can safely do at home.    Pertinent History  CVA in 2017    Limitations  Standing;Walking;Lifting    How long can you sit comfortably?  no problem    How long can you stand comfortably?  couple minutes    How long can you walk comfortably?  couple minutes    Patient Stated Goals  to be able to walk better    Currently in Pain?  Yes    Pain Score  10-Worst pain ever    Pain Location  Leg    Pain Orientation  Right    Pain Descriptors / Indicators  Numbness    Pain Type  Chronic pain    Pain Radiating Towards  knee down and into the toes - heavy. (left side sometime gets like that)    Aggravating Factors   walking, moving    Pain Relieving Factors  nothing         [facility] PT Assessment - 10/20/19 0001      Assessment   Medical Diagnosis  R hemiparesis  Referring Provider (PT)  Kari Baars    Onset Date/Surgical Date  --   CVA in 2017   Prior Therapy  yes right after surgery      Precautions   Precautions  Fall      Restrictions   Weight Bearing Restrictions  No      Balance Screen   Has the patient fallen in the past 6 months  No   but walks near furniture   Has the patient had a decrease in activity level because of a fear of falling?   Yes    Is the patient reluctant to leave their home because of a fear of falling?   Yes      Home Environment   Living Environment  Private residence    Living Arrangements  --   grandchildren stays at night   Available Help at Discharge  Family    Type of Home  House    Home Access  Stairs to enter    Entrance Stairs-Number of Steps  3    Entrance Stairs-Rails  Right    Home Layout  One level    Home Equipment  Walker - 2 wheels;Walker - 4 wheels;Cane - single point;Bedside commode      Prior Function   Level of Independence  Needs assistance with ADLs;Needs assistance with homemaking;Needs assistance with  gait      Cognition   Overall Cognitive Status  Within Functional Limits for tasks assessed      ROM / Strength   AROM / PROM / Strength  AROM;Strength      AROM   AROM Assessment Site  Knee    Right/Left Knee  Left;Right    Right Knee Extension  5   lacking    Right Knee Flexion  90   pain   Left Knee Extension  0    Left Knee Flexion  108   pain      Strength   Strength Assessment Site  Knee;Ankle;Hip    Right/Left Hip  Right;Left    Right Hip Flexion  4-/5   pain   Left Hip Flexion  4/5    Right/Left Knee  Right;Left    Right Knee Flexion  4-/5    Right Knee Extension  4/5    Left Knee Flexion  4/5    Left Knee Extension  4+/5    Right/Left Ankle  Right;Left    Right Ankle Dorsiflexion  4+/5    Left Ankle Dorsiflexion  5/5      Ambulation/Gait   Ambulation/Gait  Yes    Ambulation/Gait Assistance  5: Supervision;4: Min guard    Ambulation Distance (Feet)  --   28feet with cane and 168feet with RW   Assistive device  Straight cane;Rolling walker    Gait Pattern  Step-through pattern;Decreased stance time - right;Decreased step length - left;Lateral trunk lean to right;Wide base of support    Gait velocity  decreased    Gait Comments  with both cane and RW      Balance   Balance Assessed  --   unable to stand on either leg in SLS without assist               Objective measurements completed on examination: See above findings.              PT Education - 10/20/19 1711    Education Details  on use of RW vs cane, attempted to use cane in left  UE but too difficult for patient to coordinate. Attempted quad cane in left and this felt ok but patietn still unsure of coordination.    Person(s) Educated  Patient    Methods  Explanation    Comprehension  Verbalized understanding       PT Short Term Goals - 10/20/19 1724      PT SHORT TERM GOAL #1   Title  Patient will be independent in HEP to improve functional outcomes.    Time  4     Period  Weeks    Status  New    Target Date  11/17/19      PT SHORT TERM GOAL #2   Title  Patient will be able to ambulate at least 15feet with RW to demonstrate improved functional endurance.    Time  4    Period  Weeks    Status  New    Target Date  11/17/19        PT Long Term Goals - 10/20/19 1725      PT LONG TERM GOAL #1   Title  Patient will report at least 25% improvement in overall endurance and/or symptoms to improve QOL.    Time  8    Period  Weeks    Status  New    Target Date  12/15/19      PT LONG TERM GOAL #2   Title  Patient will be able to ambulate at least 226 feet in 2 minutes with RW to demonstrate improved functional endurance.    Time  8    Period  Weeks    Status  New    Target Date  12/15/19             Plan - 10/20/19 1718    Clinical Impression Statement  Patient presents to physical therapy with right sided weakness and gait deficits after a stroke in 2017. She had formal rehab at a facility but did not continue therapy in an outpatient clinic afterwards. Patient's primary goal is to improve strength, balance and walking by developing a strong HEP secondary to financial limitations. Patient would benefit from skilled physical therapy focusing on improving g functional limitations and decreasing fall risk.    Personal Factors and Comorbidities  Age;Comorbidity 1;Fitness;Finances    Comorbidities  CVA in 2017, Left THA, chronic pain    Examination-Activity Limitations  Bathing;Bed Mobility;Squat;Stairs;Stand;Toileting;Transfers;Lift;Locomotion Level    Examination-Participation Restrictions  Cleaning;Community Activity;Yard Work;Shop;Meal Prep    Stability/Clinical Decision Making  Stable/Uncomplicated    Clinical Decision Making  Low    Rehab Potential  Fair    PT Frequency  1x / week    PT Duration  8 weeks    PT Treatment/Interventions  ADLs/Self Care Home Management;Biofeedback;Cryotherapy;Camera operator;Therapeutic exercise;Therapeutic activities;Functional mobility training;Stair training;Gait training;Neuromuscular re-education;Patient/family education;Manual techniques;Passive range of motion    PT Next Visit Plan  devlope HEP for patient focusing on safe strength/balance exercises    PT Home Exercise Plan  non established on this date    Recommended Other Services  OT - patient not interested at this time    Consulted and Agree with Plan of Care  Patient       Patient will benefit from skilled therapeutic intervention in order to improve the following deficits and impairments:  Abnormal gait, Decreased endurance, Pain, Decreased activity tolerance, Decreased balance, Decreased coordination, Decreased range of motion, Decreased mobility, Decreased knowledge of use of DME, Decreased strength, Difficulty walking, Impaired UE functional use  Visit Diagnosis: Muscle weakness (generalized)  Difficulty in walking, not elsewhere classified     Problem List Patient Active Problem List   Diagnosis Date Noted  . Hyperlipidemia 10/11/2016  . Generalized anxiety disorder 10/11/2016  . Glaucoma 10/11/2016  . Right hemiparesis (HCC) 10/11/2016  . GERD (gastroesophageal reflux disease) 10/11/2016  . Acute CVA (cerebrovascular accident) (HCC) 10/09/2016  . HTN (hypertension) 10/09/2016  . Rectocele 09/05/2013  . Cystocele 09/05/2013  . Hemorrhoids 09/05/2013  . TOTAL HIP FOLLOW-UP 03/06/2009  . Osteoarthrosis, unspecified whether generalized or localized, pelvic region and thigh 01/10/2009  . HIP PAIN 01/10/2009   5:27 PM, 10/20/19 Tereasa CoopMichele Shaindel Sweeten, DPT Physical Therapy [facility]  [phone] office  [facility] [address] [place], [place], [address] Phone: [phone]   Fax:  [phone]  Name: Yancey FlemingsBetty K Munoz MRN: [id number] Date of Birth: 10-02-1931

## 2019-10-24 ENCOUNTER — Telehealth (HOSPITAL_COMMUNITY): Payer: Self-pay

## 2019-10-24 NOTE — Telephone Encounter (Signed)
Pt called to cancel her appt due to she has no transportation.

## 2019-10-25 ENCOUNTER — Ambulatory Visit (HOSPITAL_COMMUNITY): Payer: Medicare HMO

## 2019-11-02 ENCOUNTER — Other Ambulatory Visit: Payer: Self-pay

## 2019-11-02 ENCOUNTER — Ambulatory Visit (HOSPITAL_COMMUNITY): Payer: Medicare HMO | Attending: Pulmonary Disease | Admitting: Physical Therapy

## 2019-11-02 ENCOUNTER — Encounter (HOSPITAL_COMMUNITY): Payer: Self-pay | Admitting: Physical Therapy

## 2019-11-02 DIAGNOSIS — R262 Difficulty in walking, not elsewhere classified: Secondary | ICD-10-CM | POA: Diagnosis present

## 2019-11-02 DIAGNOSIS — M6281 Muscle weakness (generalized): Secondary | ICD-10-CM | POA: Diagnosis not present

## 2019-11-02 NOTE — Therapy (Signed)
[place], [place], [address] Phone: [phone]   Fax:  [phone]  Physical Therapy Treatment  Patient Details  Name: Krista Munoz MRN: [id number] Date of Birth: [birth year] Referring Provider (PT): Sinda Du   Encounter Date: 11/02/2019  PT End of Session - 11/02/19 1403    Visit Number  2    Number of Visits  8    Authorization Type  aetna Medicare, med necessity, no auth req.    Authorization Time Period  POC dates 10/20/19 to 12/15/2019    Authorization - Visit Number  2    Authorization - Number of Visits  8    PT Start Time  3295    PT Stop Time  1441    PT Time Calculation (min)  38 min    Equipment Utilized During Treatment  Gait belt    Activity Tolerance  Patient limited by fatigue;Patient limited by pain    Behavior During Therapy  WFL for tasks assessed/performed       Past Medical History:  Diagnosis Date  . Allergy   . Anxiety   . Cataract   . Cystocele 09/05/2013  . GERD (gastroesophageal reflux disease)   . Glaucoma   . Hemorrhoids 09/05/2013  . Hypertension   . Rectocele 09/05/2013    Past Surgical History:  Procedure Laterality Date  . EYE SURGERY Left    tube placed and cataract removed  . HIP SURGERY Left 2010    hip replacement.    There were no vitals filed for this visit.  Subjective Assessment - 11/02/19 1406    Subjective  States she is really stiff today, but she is always stiff. States both her knees hurt, but they always hurt. States she wishes she was home, it hurts to move and doesn't really want to exercise.    Pertinent History  CVA in 2017    Limitations  Standing;Walking;Lifting    How long can you sit comfortably?  no problem    How long can you stand comfortably?  couple minutes    How long can you walk comfortably?  couple minutes    Patient Stated Goals  to be able to walk better    Currently in Pain?  Yes    Pain Score  9    worse with standing  and WB   Pain Location  Knee    Pain Orientation  Right;Left    Pain Descriptors / Indicators  Sharp;Shooting;Numbness    Pain Type  Chronic pain         [facility] PT Assessment - 11/02/19 0001      Assessment   Medical Diagnosis  R hemiparesis     Referring Provider (PT)  Sinda Du                   [facility] Adult PT Treatment/Exercise - 11/02/19 0001      Exercises   Exercises  Knee/Hip;Ankle      Knee/Hip Exercises: Stretches   Active Hamstring Stretch  Both;3 reps;30 seconds   VERBAL cues throughout to not go into pain      Knee/Hip Exercises: Seated   Long Arc Quad  AROM;Strengthening;Both;3 sets;10 reps   verbal cues throughout    Other Seated Knee/Hip Exercises  heel and toe raises x15, 5" holds B    Abduction/Adduction   AROM;Strengthening;Both;10 reps;3 sets   5" holds   Abd/Adduction Limitations  RTB for abd and towel between knees  for ADD             PT Education - 11/02/19 1440    Education Details  on HEP, on importance of mobility and strengthening exercises to improve/decrease stiffness.    Person(s) Educated  Patient    Methods  Explanation    Comprehension  Verbalized understanding       PT Short Term Goals - 10/20/19 1724      PT SHORT TERM GOAL #1   Title  Patient will be independent in HEP to improve functional outcomes.    Time  4    Period  Weeks    Status  New    Target Date  11/17/19      PT SHORT TERM GOAL #2   Title  Patient will be able to ambulate at least 14675feet with RW to demonstrate improved functional endurance.    Time  4    Period  Weeks    Status  New    Target Date  11/17/19        PT Long Term Goals - 10/20/19 1725      PT LONG TERM GOAL #1   Title  Patient will report at least 25% improvement in overall endurance and/or symptoms to improve QOL.    Time  8    Period  Weeks    Status  New    Target Date  12/15/19      PT LONG TERM GOAL #2   Title  Patient will be able to ambulate at least 226  feet in 2 minutes with RW to demonstrate improved functional endurance.    Time  8    Period  Weeks    Status  New    Target Date  12/15/19            Plan - 11/02/19 1407    Clinical Impression Statement  Focus today was on establishing home exercise program that patient would actually perform at home as she is hesitant to do exercises or movements secondary to pain and stiffness in her joints. Educated patient on importance of mobility work in regards to decreasing stiffness and improving overall functional mobility. Patient required frequent verbal cues throughout session to keep her on the appropriate task at hand (changed exercise between sets if not cued). Patient would continue to benefit from skilled physical therapy focusing on mobility, strength and developing HEP for patient to continue to work towards mobility goals.    Personal Factors and Comorbidities  Age;Comorbidity 1;Fitness;Finances    Comorbidities  CVA in 2017, Left THA, chronic pain    Examination-Activity Limitations  Bathing;Bed Mobility;Squat;Stairs;Stand;Toileting;Transfers;Lift;Locomotion Level    Examination-Participation Restrictions  Cleaning;Community Activity;Yard Work;Shop;Meal Prep    Stability/Clinical Decision Making  Stable/Uncomplicated    Rehab Potential  Fair    PT Frequency  1x / week    PT Duration  8 weeks    PT Treatment/Interventions  ADLs/Self Care Home Management;Biofeedback;Cryotherapy;Microbiologistlectrical Stimulation;Balance training;Therapeutic exercise;Therapeutic activities;Functional mobility training;Stair training;Gait training;Neuromuscular re-education;Patient/family education;Manual techniques;Passive range of motion    PT Next Visit Plan  develope HEP for patient focusing on safe strength/balance exercises    PT Home Exercise Plan  112/2 - seated heel and toe raises, hip add, LAQs, hamstring stretch, hip ABD and ADD isometrics    Consulted and Agree with Plan of Care  Patient        Patient will benefit from skilled therapeutic intervention in order to improve the following deficits and impairments:  Abnormal gait, Decreased endurance,  Pain, Decreased activity tolerance, Decreased balance, Decreased coordination, Decreased range of motion, Decreased mobility, Decreased knowledge of use of DME, Decreased strength, Difficulty walking, Impaired UE functional use  Visit Diagnosis: Muscle weakness (generalized)  Difficulty in walking, not elsewhere classified     Problem List Patient Active Problem List   Diagnosis Date Noted  . Hyperlipidemia 10/11/2016  . Generalized anxiety disorder 10/11/2016  . Glaucoma 10/11/2016  . Right hemiparesis (HCC) 10/11/2016  . GERD (gastroesophageal reflux disease) 10/11/2016  . Acute CVA (cerebrovascular accident) (HCC) 10/09/2016  . HTN (hypertension) 10/09/2016  . Rectocele 09/05/2013  . Cystocele 09/05/2013  . Hemorrhoids 09/05/2013  . TOTAL HIP FOLLOW-UP 03/06/2009  . Osteoarthrosis, unspecified whether generalized or localized, pelvic region and thigh 01/10/2009  . HIP PAIN 01/10/2009   2:43 PM, 11/02/19 Tereasa Coop, DPT Physical Therapy with [facility]  [phone] office  [facility] [address] [place], [place], [address] Phone: [phone]   Fax:  [phone]  Name: AVIKA CARBINE MRN: [id number] Date of Birth: March 24, 1931

## 2019-11-02 NOTE — Patient Instructions (Signed)
Access Code: [email or web address]  URL: [email or web address]  Date: 11/02/2019  Prepared by: Jerene Pitch   Exercises Seated Heel Toe Raises - 15 reps - 2 sets - 5 hold - 1x daily - 7x weekly Seated Heel Toe Raises - 15 reps - 2 sets - 5 hold - 1x daily - 7x weekly Seated Hamstring Stretch - 1 reps - 1 sets - 30 hold - 1x daily - 7x weekly Seated Long Arc Quad - 10 reps - 3 sets - 1x daily - 7x weekly Seated Hip Adduction Isometrics with Ball - 10 reps - 2 sets - 5 hold - 1x daily - 7x weekly Seated Hip Abduction with Resistance - 10 reps - 2 sets - 5 hold - 1x daily - 7x weekly

## 2019-11-11 ENCOUNTER — Other Ambulatory Visit: Payer: Self-pay

## 2019-11-11 ENCOUNTER — Ambulatory Visit (HOSPITAL_COMMUNITY): Payer: Medicare HMO | Admitting: Physical Therapy

## 2019-11-11 ENCOUNTER — Encounter (HOSPITAL_COMMUNITY): Payer: Self-pay | Admitting: Physical Therapy

## 2019-11-11 DIAGNOSIS — R262 Difficulty in walking, not elsewhere classified: Secondary | ICD-10-CM

## 2019-11-11 DIAGNOSIS — M6281 Muscle weakness (generalized): Secondary | ICD-10-CM

## 2019-11-11 NOTE — Therapy (Signed)
[facility] [address] [place], [place], [address] Phone: [phone]   Fax:  [phone]  Physical Therapy Treatment  Patient Details  Name: Krista Munoz MRN: [id number] Date of Birth: 03/12/1931 Referring Provider (PT): Kari BaarsEdward Hawkins   Encounter Date: 11/11/2019  PT End of Session - 11/11/19 1324    Visit Number  3    Number of Visits  8    Authorization Type  aetna Medicare, med necessity, no auth req.    Authorization Time Period  POC dates 10/20/19 to 12/15/2019    Authorization - Visit Number  3    Authorization - Number of Visits  8    PT Start Time  1318    PT Stop Time  1358    PT Time Calculation (min)  40 min    Equipment Utilized During Treatment  Gait belt    Activity Tolerance  Patient limited by fatigue;Patient limited by pain    Behavior During Therapy  WFL for tasks assessed/performed       Past Medical History:  Diagnosis Date  . Allergy   . Anxiety   . Cataract   . Cystocele 09/05/2013  . GERD (gastroesophageal reflux disease)   . Glaucoma   . Hemorrhoids 09/05/2013  . Hypertension   . Rectocele 09/05/2013    Past Surgical History:  Procedure Laterality Date  . EYE SURGERY Left    tube placed and cataract removed  . HIP SURGERY Left 2010    hip replacement.    There were no vitals filed for this visit.  Subjective Assessment - 11/11/19 1319    Subjective  States she feels alright, still hobbling around. States she is doing her exercises and she thinks they have helped because she doesn't seem to hurt as bad when she goes to bed. States she tries to do her exercises twice a day.    Pertinent History  CVA in 2017    Limitations  Standing;Walking;Lifting    How long can you sit comfortably?  no problem    How long can you stand comfortably?  couple minutes    How long can you walk comfortably?  couple minutes    Patient Stated Goals  to be able to walk better    Currently in Pain?  Yes    Pain  Score  5     Pain Location  Knee    Pain Descriptors / Indicators  Aching    Pain Type  Chronic pain         [facility] PT Assessment - 11/11/19 0001      Assessment   Medical Diagnosis  R hemiparesis     Referring Provider (PT)  Kari BaarsEdward Hawkins                   [facility]/Exercise - 11/11/19 0001      Knee/Hip Exercises: Standing   Other Standing Knee Exercises  Narrow Base of support balance 2x3, 30" holds, with head turns 4x5 B -no UE support, 4x5 cervical nods no UE support              PT Education - 11/11/19 1344    Education Details  on safety with exercises. How you need to train balance to improve and improve safety.    Person(s) Educated  Patient    Methods  Explanation    Comprehension  Verbalized understanding       PT Short Term Goals - 10/20/19  [place] #1   Title  Patient will be independent in HEP to improve functional outcomes.    Time  4    Period  Weeks    Status  New    Target Date  11/17/19      PT SHORT TERM GOAL #2   Title  Patient will be able to ambulate at least 113feet with RW to demonstrate improved functional endurance.    Time  4    Period  Weeks    Status  New    Target Date  11/17/19        PT Long Term Goals - 10/20/19 1725      PT LONG TERM GOAL #1   Title  Patient will report at least 25% improvement in overall endurance and/or symptoms to improve QOL.    Time  8    Period  Weeks    Status  New    Target Date  12/15/19      PT LONG TERM GOAL #2   Title  Patient will be able to ambulate at least 226 feet in 2 minutes with RW to demonstrate improved functional endurance.    Time  8    Period  Weeks    Status  New    Target Date  12/15/19            Plan - 11/11/19 1331    Clinical Impression Statement  Focus today was on initiating safe balance program for patient to perform at home. Stressed safety and performing all exercises near sturdy surface. Patient  verbalized understanding with this. Fatigue noted between sets so cued patient for longer rest break. able to perform all exercises in clinic independently and safely, adding to home program. Patient would continue to benefit from skilled physical therapy at this time.    Personal Factors and Comorbidities  Age;Comorbidity 1;Fitness;Finances    Comorbidities  CVA in 2017, Left THA, chronic pain    Examination-Activity Limitations  Bathing;Bed Mobility;Squat;Stairs;Stand;Toileting;Transfers;Lift;Locomotion Level    Examination-Participation Restrictions  Cleaning;Community Activity;Yard Work;Shop;Meal Prep    Stability/Clinical Decision Making  Stable/Uncomplicated    Rehab Potential  Fair    PT Frequency  1x / week    PT Duration  8 weeks    PT Treatment/Interventions  ADLs/Self Care Home Management;Biofeedback;Cryotherapy;Lobbyist;Therapeutic exercise;Therapeutic activities;Functional mobility training;Stair training;Gait training;Neuromuscular re-education;Patient/family education;Manual techniques;Passive range of motion    PT Next Visit Plan  develope HEP for patient focusing on safe strength/balance exercises    PT Home Exercise Plan  11/22 - seated heel and toe raises, hip add, LAQs, hamstring stretch, hip ABD and ADD isometrics; 12/11 narrow BOS exercises - static head turns and head nods    Consulted and Agree with Plan of Care  Patient       Patient will benefit from skilled therapeutic intervention in order to improve the following deficits and impairments:  Abnormal gait, Decreased endurance, Pain, Decreased activity tolerance, Decreased balance, Decreased coordination, Decreased range of motion, Decreased mobility, Decreased knowledge of use of DME, Decreased strength, Difficulty walking, Impaired UE functional use  Visit Diagnosis: Muscle weakness (generalized)  Difficulty in walking, not elsewhere classified     Problem List Patient Active  Problem List   Diagnosis Date Noted  . Hyperlipidemia 10/11/2016  . Generalized anxiety disorder 10/11/2016  . Glaucoma 10/11/2016  . Right hemiparesis ([place]) 10/11/2016  . GERD (gastroesophageal reflux disease) 10/11/2016  . Acute CVA (cerebrovascular accident) ([place])  10/09/2016  . HTN (hypertension) 10/09/2016  . Rectocele 09/05/2013  . Cystocele 09/05/2013  . Hemorrhoids 09/05/2013  . TOTAL HIP FOLLOW-UP 03/06/2009  . Osteoarthrosis, unspecified whether generalized or localized, pelvic region and thigh 01/10/2009  . HIP PAIN 01/10/2009   1:50 PM, 11/11/19 Tereasa Coop, DPT Physical Therapy with [facility]  [phone] office  [facility] [address] [place], [place], [address] Phone: [phone]   Fax:  [phone]  Name: Krista Munoz MRN: [id number] Date of Birth: 30-Jan-1931

## 2019-11-18 ENCOUNTER — Encounter (HOSPITAL_COMMUNITY): Payer: Self-pay | Admitting: Physical Therapy

## 2019-11-18 ENCOUNTER — Ambulatory Visit (HOSPITAL_COMMUNITY): Payer: Medicare HMO | Admitting: Physical Therapy

## 2019-11-18 ENCOUNTER — Other Ambulatory Visit: Payer: Self-pay

## 2019-11-18 DIAGNOSIS — M6281 Muscle weakness (generalized): Secondary | ICD-10-CM

## 2019-11-18 DIAGNOSIS — R262 Difficulty in walking, not elsewhere classified: Secondary | ICD-10-CM

## 2019-11-18 NOTE — Therapy (Signed)
[facility] [address] [place], [place], [address] Phone: [phone]   Fax:  [phone]  Physical Therapy Treatment  Patient Details  Name: Krista Munoz MRN: [id number] Date of Birth: 05-02-1931 Referring Provider (PT): Kari BaarsEdward Hawkins   Encounter Date: 11/18/2019  PT End of Session - 11/18/19 1236    Visit Number  4    Number of Visits  8    Authorization Type  aetna Medicare, med necessity, no auth req.    Authorization Time Period  POC dates 10/20/19 to 12/15/2019    Authorization - Visit Number  4    Authorization - Number of Visits  8    PT Start Time  1230    PT Stop Time  1310    PT Time Calculation (min)  40 min    Equipment Utilized During Treatment  Gait belt    Activity Tolerance  Patient limited by fatigue;Patient limited by pain    Behavior During Therapy  WFL for tasks assessed/performed       Past Medical History:  Diagnosis Date  . Allergy   . Anxiety   . Cataract   . Cystocele 09/05/2013  . GERD (gastroesophageal reflux disease)   . Glaucoma   . Hemorrhoids 09/05/2013  . Hypertension   . Rectocele 09/05/2013    Past Surgical History:  Procedure Laterality Date  . EYE SURGERY Left    tube placed and cataract removed  . HIP SURGERY Left 2010    hip replacement.    There were no vitals filed for this visit.  Subjective Assessment - 11/18/19 1235    Subjective  States she has some good days and some bad days. States she thinks the exercises are helping because they aren't cramping as much at night.    Pertinent History  CVA in 2017    Limitations  Standing;Walking;Lifting    How long can you sit comfortably?  no problem    How long can you stand comfortably?  couple minutes    How long can you walk comfortably?  couple minutes    Patient Stated Goals  to be able to walk better    Currently in Pain?  Yes    Pain Score  7     Pain Location  Knee    Pain Orientation  Right;Left    Pain Descriptors  / Indicators  Aching    Pain Type  Chronic pain         [facility] PT Assessment - 11/18/19 0001      Assessment   Medical Diagnosis  R hemiparesis     Referring Provider (PT)  Kari BaarsEdward Hawkins                   [facility]/Exercise - 11/18/19 0001      Knee/Hip Exercises: Stretches   Active Hamstring Stretch  Both;3 reps;30 seconds      Knee/Hip Exercises: Seated   Long Arc Quad  AROM;Strengthening;Both;5 reps;4 sets    Harley-DavidsonBall Squeeze  4x5, 5" holds    Other Seated Knee/Hip Exercises  shoulder flexion with cane 3x5    Marching  AROM;Strengthening;Both;4 sets;5 sets    Sit to Sand  5 reps   4 sets, seat elevated w/black pad& 2 towels, hands on thighs              PT Short Term Goals - 10/20/19 1724      PT SHORT TERM GOAL #1  Title  Patient will be independent in HEP to improve functional outcomes.    Time  4    Period  Weeks    Status  New    Target Date  11/17/19      PT SHORT TERM GOAL #2   Title  Patient will be able to ambulate at least 161feet with RW to demonstrate improved functional endurance.    Time  4    Period  Weeks    Status  New    Target Date  11/17/19        PT Long Term Goals - 10/20/19 1725      PT LONG TERM GOAL #1   Title  Patient will report at least 25% improvement in overall endurance and/or symptoms to improve QOL.    Time  8    Period  Weeks    Status  New    Target Date  12/15/19      PT LONG TERM GOAL #2   Title  Patient will be able to ambulate at least 226 feet in 2 minutes with RW to demonstrate improved functional endurance.    Time  8    Period  Weeks    Status  New    Target Date  12/15/19            Plan - 11/18/19 1252    Clinical Impression Statement  Reviewed exercises for HEP secondary to patient concerns she was not doing them currently. Verbal cues for form and patient was able to correct most exercises. Did not add exercises today secondary to patient fatigue, need for HEP review  and pain status on this date. Patient able to tolerate more exercises with shorter rest breaks on this date. Patient would continue to benefit from skilled physical therapy.    Personal Factors and Comorbidities  Age;Comorbidity 1;Fitness;Finances    Comorbidities  CVA in 2017, Left THA, chronic pain    Examination-Activity Limitations  Bathing;Bed Mobility;Squat;Stairs;Stand;Toileting;Transfers;Lift;Locomotion Level    Examination-Participation Restrictions  Cleaning;Community Activity;Yard Work;Shop;Meal Prep    Stability/Clinical Decision Making  Stable/Uncomplicated    Rehab Potential  Fair    PT Frequency  1x / week    PT Duration  8 weeks    PT Treatment/Interventions  ADLs/Self Care Home Management;Biofeedback;Cryotherapy;Microbiologist;Therapeutic exercise;Therapeutic activities;Functional mobility training;Stair training;Gait training;Neuromuscular re-education;Patient/family education;Manual techniques;Passive range of motion    PT Next Visit Plan  develope HEP for patient focusing on safe strength/balance exercises    PT Home Exercise Plan  11/22 - seated heel and toe raises, hip add, LAQs, hamstring stretch, hip ABD and ADD isometrics; 12/11 narrow BOS exercises - static head turns and head nods    Consulted and Agree with Plan of Care  Patient       Patient will benefit from skilled therapeutic intervention in order to improve the following deficits and impairments:  Abnormal gait, Decreased endurance, Pain, Decreased activity tolerance, Decreased balance, Decreased coordination, Decreased range of motion, Decreased mobility, Decreased knowledge of use of DME, Decreased strength, Difficulty walking, Impaired UE functional use  Visit Diagnosis: Muscle weakness (generalized)  Difficulty in walking, not elsewhere classified     Problem List Patient Active Problem List   Diagnosis Date Noted  . Hyperlipidemia 10/11/2016  . Generalized anxiety disorder  10/11/2016  . Glaucoma 10/11/2016  . Right hemiparesis ([facility]) 10/11/2016  . GERD (gastroesophageal reflux disease) 10/11/2016  . Acute CVA (cerebrovascular accident) ([facility]) 10/09/2016  . HTN (hypertension) 10/09/2016  . Rectocele 09/05/2013  .  Cystocele 09/05/2013  . Hemorrhoids 09/05/2013  . TOTAL HIP FOLLOW-UP 03/06/2009  . Osteoarthrosis, unspecified whether generalized or localized, pelvic region and thigh 01/10/2009  . HIP PAIN 01/10/2009    1:07 PM, 11/18/19 Jerene Pitch, DPT Physical Therapy with [facility]  [phone] office  [place] [address] [place], [place], [address] Phone: [phone]   Fax:  [phone]  Name: Krista Munoz MRN: [id number] Date of Birth: 25-Jul-1931

## 2019-11-22 ENCOUNTER — Encounter (HOSPITAL_COMMUNITY): Payer: Self-pay | Admitting: Physical Therapy

## 2019-11-22 ENCOUNTER — Other Ambulatory Visit: Payer: Self-pay

## 2019-11-22 ENCOUNTER — Ambulatory Visit (HOSPITAL_COMMUNITY): Payer: Medicare HMO | Admitting: Physical Therapy

## 2019-11-22 DIAGNOSIS — M6281 Muscle weakness (generalized): Secondary | ICD-10-CM | POA: Diagnosis not present

## 2019-11-22 DIAGNOSIS — R262 Difficulty in walking, not elsewhere classified: Secondary | ICD-10-CM

## 2019-11-22 NOTE — Therapy (Signed)
[facility] [address] [place], [place], [address] Phone: [phone]   Fax:  [phone]  Physical Therapy Treatment  Patient Details  Name: Krista Munoz MRN: [id number] Date of Birth: 1931/11/04 Referring Provider (PT): Kari Baars   Encounter Date: 11/22/2019  PT End of Session - 11/22/19 1137    Visit Number  5    Number of Visits  8    Authorization Type  aetna Medicare, med necessity, no auth req.    Authorization Time Period  POC dates 10/20/19 to 12/15/2019    Authorization - Visit Number  5    Authorization - Number of Visits  8    PT Start Time  1133    PT Stop Time  1213    PT Time Calculation (min)  40 min    Equipment Utilized During Treatment  Gait belt    Activity Tolerance  Patient limited by fatigue;Patient limited by pain    Behavior During Therapy  WFL for tasks assessed/performed       Past Medical History:  Diagnosis Date  . Allergy   . Anxiety   . Cataract   . Cystocele 09/05/2013  . GERD (gastroesophageal reflux disease)   . Glaucoma   . Hemorrhoids 09/05/2013  . Hypertension   . Rectocele 09/05/2013    Past Surgical History:  Procedure Laterality Date  . EYE SURGERY Left    tube placed and cataract removed  . HIP SURGERY Left 2010    hip replacement.    There were no vitals filed for this visit.  Subjective Assessment - 11/22/19 1131    Subjective  States she feels ok. States her legs were really bothering her last night and kept her from sleeping. States her balance still feels off.    Pertinent History  CVA in 2017    Limitations  Standing;Walking;Lifting    How long can you sit comfortably?  no problem    How long can you stand comfortably?  couple minutes    How long can you walk comfortably?  couple minutes    Patient Stated Goals  to be able to walk better    Currently in Pain?  Yes    Pain Score  8     Pain Location  Knee   with weightbearing.   Pain Orientation  Left;Right          [facility] PT Assessment - 11/22/19 0001      Assessment   Medical Diagnosis  R hemiparesis     Referring Provider (PT)  Kari Baars                   [facility] Adult PT Treatment/Exercise - 11/22/19 0001      Knee/Hip Exercises: Seated   Ball Squeeze  4x5, 5" holds    Other Seated Knee/Hip Exercises  shoulder rolls -2x10,  forward ones only, posterior ones painful     Other Seated Knee/Hip Exercises  lumbar rotation - painfree ROM - 4x5, Bilateral             PT Education - 11/22/19 1144    Education Details  on current presentation. On arthritis (patient fixated on arthritis beign cause of pain and balance issues) - and thoughts on needing surgery to fix her balance and strength. educated patient on typical timeline for progression for strength/balance and with her current compliance with home program. Discussed risks of durther deconditioning and balance deficits with additional surgery and typical presentation  of arthritis in individuals her age.    Person(s) Educated  Patient    Methods  Explanation    Comprehension  Verbalized understanding       PT Short Term Goals - 10/20/19 1724      PT SHORT TERM GOAL #1   Title  Patient will be independent in HEP to improve functional outcomes.    Time  4    Period  Weeks    Status  New    Target Date  11/17/19      PT SHORT TERM GOAL #2   Title  Patient will be able to ambulate at least 152feet with RW to demonstrate improved functional endurance.    Time  4    Period  Weeks    Status  New    Target Date  11/17/19        PT Long Term Goals - 10/20/19 1725      PT LONG TERM GOAL #1   Title  Patient will report at least 25% improvement in overall endurance and/or symptoms to improve QOL.    Time  8    Period  Weeks    Status  New    Target Date  12/15/19      PT LONG TERM GOAL #2   Title  Patient will be able to ambulate at least 226 feet in 2 minutes with RW to demonstrate improved functional  endurance.    Time  8    Period  Weeks    Status  New    Target Date  12/15/19            Plan - 11/22/19 1147    Clinical Impression Statement  Focus today was on education on current presentation, anticipated timeline for progress, benefits of continued therapy and how movement is required to correct for a loss of balance. Discussed how surgery will not fix her balance, fatigue or strength deficits as she hopes it will.  Educated patient on safe pain free movements and importance of not pushing into pain. Patient still very limited in functional joint mobility, tolerance/endurance to exercises and decreased gait speed and would still benefit from skilled physical therapy at this time.    Personal Factors and Comorbidities  Age;Comorbidity 1;Fitness;Finances    Comorbidities  CVA in 2017, Left THA, chronic pain    Examination-Activity Limitations  Bathing;Bed Mobility;Squat;Stairs;Stand;Toileting;Transfers;Lift;Locomotion Level    Examination-Participation Restrictions  Cleaning;Community Activity;Yard Work;Shop;Meal Prep    Stability/Clinical Decision Making  Stable/Uncomplicated    Rehab Potential  Fair    PT Frequency  1x / week    PT Duration  8 weeks    PT Treatment/Interventions  ADLs/Self Care Home Management;Biofeedback;Cryotherapy;Lobbyist;Therapeutic exercise;Therapeutic activities;Functional mobility training;Stair training;Gait training;Neuromuscular re-education;Patient/family education;Manual techniques;Passive range of motion    PT Next Visit Plan  develop HEP for patient focusing on safe strength/balance exercises    PT Home Exercise Plan  11/22 - seated heel and toe raises, hip add, LAQs, hamstring stretch, hip ABD and ADD isometrics; 12/11 narrow BOS exercises - static head turns and head nods    Consulted and Agree with Plan of Care  Patient       Patient will benefit from skilled therapeutic intervention in order to improve the  following deficits and impairments:  Abnormal gait, Decreased endurance, Pain, Decreased activity tolerance, Decreased balance, Decreased coordination, Decreased range of motion, Decreased mobility, Decreased knowledge of use of DME, Decreased strength, Difficulty walking, Impaired UE functional use  Visit Diagnosis:  Muscle weakness (generalized)  Difficulty in walking, not elsewhere classified     Problem List Patient Active Problem List   Diagnosis Date Noted  . Hyperlipidemia 10/11/2016  . Generalized anxiety disorder 10/11/2016  . Glaucoma 10/11/2016  . Right hemiparesis ([facility]) 10/11/2016  . GERD (gastroesophageal reflux disease) 10/11/2016  . Acute CVA (cerebrovascular accident) ([facility]) 10/09/2016  . HTN (hypertension) 10/09/2016  . Rectocele 09/05/2013  . Cystocele 09/05/2013  . Hemorrhoids 09/05/2013  . TOTAL HIP FOLLOW-UP 03/06/2009  . Osteoarthrosis, unspecified whether generalized or localized, pelvic region and thigh 01/10/2009  . HIP PAIN 01/10/2009    1:14 PM, 11/22/19 Tereasa CoopMichele Edenilson Austad, DPT Physical [facility]  [phone] office  [facility] [address] [place], [place], [address] Phone: [phone]   Fax:  [phone]  Name: Krista Munoz MRN: [id number] Date of Birth: [birth year]

## 2019-11-29 ENCOUNTER — Ambulatory Visit (HOSPITAL_COMMUNITY): Payer: Medicare HMO | Admitting: Physical Therapy

## 2019-11-29 ENCOUNTER — Other Ambulatory Visit: Payer: Self-pay

## 2019-11-29 ENCOUNTER — Encounter (HOSPITAL_COMMUNITY): Payer: Self-pay | Admitting: Physical Therapy

## 2019-11-29 DIAGNOSIS — M6281 Muscle weakness (generalized): Secondary | ICD-10-CM

## 2019-11-29 DIAGNOSIS — R262 Difficulty in walking, not elsewhere classified: Secondary | ICD-10-CM

## 2019-11-29 NOTE — Therapy (Signed)
[facility] [address] [place], [place], [address] Phone: [phone]   Fax:  [phone]  Physical Therapy Treatment and Discharge Note  Patient Details  Name: Krista Munoz MRN: [id number] Date of Birth: 06/07/1931 Referring Provider (PT): Sinda Du  PHYSICAL THERAPY DISCHARGE SUMMARY  Visits from Start of Care: 6  Current functional level related to goals / functional outcomes: See goals below   Remaining deficits: Continued pain and functional mobility difficulties   Education / Equipment: On current condition, time to make change/progress, review of HEP and continued need for mobility/strengthening work   Plan: Patient agrees to discharge.  Patient goals were not met. Patient is being discharged due to lack of progress.  ?????        Encounter Date: 11/29/2019  PT End of Session - 11/29/19 1139    Visit Number  6    Number of Visits  8    Authorization Type  aetna Medicare, med necessity, no auth req.    Authorization Time Period  POC dates 10/20/19 to 12/15/2019    Authorization - Visit Number  6    Authorization - Number of Visits  8    PT Start Time  6962   patient late to session.   PT Stop Time  1214    PT Time Calculation (min)  38 min    Equipment Utilized During Treatment  Gait belt    Activity Tolerance  Patient limited by fatigue;Patient limited by pain    Behavior During Therapy  WFL for tasks assessed/performed       Past Medical History:  Diagnosis Date  . Allergy   . Anxiety   . Cataract   . Cystocele 09/05/2013  . GERD (gastroesophageal reflux disease)   . Glaucoma   . Hemorrhoids 09/05/2013  . Hypertension   . Rectocele 09/05/2013    Past Surgical History:  Procedure Laterality Date  . EYE SURGERY Left    tube placed and cataract removed  . HIP SURGERY Left 2010    hip replacement.    There were no vitals filed for this visit.  Subjective Assessment - 11/29/19 1138    Subjective  States today is a bad day. States that she can't bend her knees and when she tries she can't bend her hips. States she is just in pain all the time.    Pertinent History  CVA in 2017    Limitations  Standing;Walking;Lifting    How long can you sit comfortably?  no problem    How long can you stand comfortably?  couple minutes    How long can you walk comfortably?  couple minutes    Patient Stated Goals  to be able to walk better    Currently in Pain?  Yes    Pain Score  8     Pain Location  Knee    Pain Orientation  Left;Right    Pain Descriptors / Indicators  Aching         [facility] PT Assessment - 11/29/19 0001      Assessment   Medical Diagnosis  R hemiparesis     Referring Provider (PT)  Sinda Du      AROM   Right Knee Extension  5    Right Knee Flexion  90    Left Knee Extension  0    Left Knee Flexion  95      Strength   Right Hip Flexion  4-/5  Left Hip Flexion  4/5    Right Knee Flexion  4-/5    Right Knee Extension  4/5    Left Knee Flexion  4/5    Left Knee Extension  4+/5      Ambulation/Gait   Ambulation/Gait  Yes    Ambulation/Gait Assistance  5: Supervision    Ambulation Distance (Feet)  138 Feet    Assistive device  Rolling walker    Gait Pattern  Step-through pattern;Decreased stance time - right;Decreased step length - left;Lateral trunk lean to right;Wide base of support    Gait velocity  decreased    Gait Comments  74m                   [facility] Adult PT Treatment/Exercise - 11/29/19 0001      Knee/Hip Exercises: Aerobic   Nustep  5 miuntes with guidance and cues from PT              PT Education - 11/29/19 1311    Education Details  on home program, anticipated timeline to establish change/improvement in balance, strength and ROM. Importance and use of RM (still continues to use cane despite unsteadiness and education). On following up with MD about current functional mobilit and pain symptoms.    Person(s)  Educated  Patient    Methods  Explanation    Comprehension  Verbalized understanding       PT Short Term Goals - 11/29/19 1153      PT SHORT TERM GOAL #1   Title  Patient will be independent in HEP to improve functional outcomes.    Time  4    Period  Weeks    Status  Achieved    Target Date  11/17/19      PT SHORT TERM GOAL #2   Title  Patient will be able to ambulate at least 1779ft with RW to demonstrate improved functional endurance.    Baseline  12/29 138 FT with RW    Time  4    Period  Weeks    Status  On-going    Target Date  11/17/19        PT Long Term Goals - 11/29/19 1153      PT LONG TERM GOAL #1   Title  Patient will report at least 25% improvement in overall endurance and/or symptoms to improve QOL.    Baseline  12/29 "not much" 10% maybe    Time  8    Period  Weeks    Status  On-going      PT LONG TERM GOAL #2   Title  Patient will be able to ambulate at least 226 feet in 2 minutes with RW to demonstrate improved functional endurance.    Baseline  12/29 138 FT with RW    Time  8    Period  Weeks    Status  On-going            Plan - 11/29/19 1145    Clinical Impression Statement  Focus today was on education. Patient continues to complain of pain limiting her ability to move and bend her knees. Patient does report PT is helping but she is just in pain all the time she wishes there was something else she could do. Discussed current functional status and lack of progress made since start of PT. 1 out of 2 short term goals met and 0 out of 2 long term goals met at this time. Patient to  discharge from physical therapy at this time secondary to lack of progress and patient desire to follow up with MD. Patient to discharge from PT at this time to HEP. Reviewed HEP and answered all questions prior to discharge.    Personal Factors and Comorbidities  Age;Comorbidity 1;Fitness;Finances    Comorbidities  CVA in 2017, Left THA, chronic pain     Examination-Activity Limitations  Bathing;Bed Mobility;Squat;Stairs;Stand;Toileting;Transfers;Lift;Locomotion Level    Examination-Participation Restrictions  Cleaning;Community Activity;Yard Work;Shop;Meal Prep    Stability/Clinical Decision Making  Stable/Uncomplicated    Rehab Potential  Fair    PT Frequency  1x / week    PT Duration  8 weeks    PT Treatment/Interventions  ADLs/Self Care Home Management;Biofeedback;Cryotherapy;Lobbyist;Therapeutic exercise;Therapeutic activities;Functional mobility training;Stair training;Gait training;Neuromuscular re-education;Patient/family education;Manual techniques;Passive range of motion    PT Next Visit Plan  patient to DC from PT to home program.    PT Home Exercise Plan  11/22 - seated heel and toe raises, hip add, LAQs, hamstring stretch, hip ABD and ADD isometrics; 12/11 narrow BOS exercises - static head turns and head nods    Consulted and Agree with Plan of Care  Patient       Patient will benefit from skilled therapeutic intervention in order to improve the following deficits and impairments:  Abnormal gait, Decreased endurance, Pain, Decreased activity tolerance, Decreased balance, Decreased coordination, Decreased range of motion, Decreased mobility, Decreased knowledge of use of DME, Decreased strength, Difficulty walking, Impaired UE functional use  Visit Diagnosis: Muscle weakness (generalized)  Difficulty in walking, not elsewhere classified     Problem List Patient Active Problem List   Diagnosis Date Noted  . Hyperlipidemia 10/11/2016  . Generalized anxiety disorder 10/11/2016  . Glaucoma 10/11/2016  . Right hemiparesis ([place]) 10/11/2016  . GERD (gastroesophageal reflux disease) 10/11/2016  . Acute CVA (cerebrovascular accident) ([place]) 10/09/2016  . HTN (hypertension) 10/09/2016  . Rectocele 09/05/2013  . Cystocele 09/05/2013  . Hemorrhoids 09/05/2013  . TOTAL HIP FOLLOW-UP 03/06/2009  .  Osteoarthrosis, unspecified whether generalized or localized, pelvic region and thigh 01/10/2009  . HIP PAIN 01/10/2009   1:30 PM, 11/29/19 Jerene Pitch, DPT Physical Therapy with [facility]  [phone] office  [facility] [address] [place], [place], [address] Phone: [phone]   Fax:  [phone]  Name: Krista Munoz MRN: [id number] Date of Birth: 05/15/1931

## 2019-12-09 ENCOUNTER — Ambulatory Visit (HOSPITAL_COMMUNITY): Payer: Medicare HMO | Admitting: Physical Therapy

## 2019-12-16 ENCOUNTER — Ambulatory Visit (HOSPITAL_COMMUNITY): Payer: Medicare HMO | Admitting: Physical Therapy

## 2020-05-02 ENCOUNTER — Other Ambulatory Visit: Payer: Self-pay

## 2020-05-02 ENCOUNTER — Encounter: Payer: Self-pay | Admitting: Orthopedic Surgery

## 2020-05-02 ENCOUNTER — Ambulatory Visit (INDEPENDENT_AMBULATORY_CARE_PROVIDER_SITE_OTHER): Payer: Medicare HMO | Admitting: Orthopedic Surgery

## 2020-05-02 VITALS — BP 115/92 | HR 75 | Ht 62.0 in | Wt 144.0 lb

## 2020-05-02 DIAGNOSIS — G8929 Other chronic pain: Secondary | ICD-10-CM | POA: Diagnosis not present

## 2020-05-02 DIAGNOSIS — M25561 Pain in right knee: Secondary | ICD-10-CM | POA: Diagnosis not present

## 2020-05-02 DIAGNOSIS — M25562 Pain in left knee: Secondary | ICD-10-CM | POA: Diagnosis not present

## 2020-05-02 NOTE — Progress Notes (Signed)
Chief Complaint  Patient presents with  . Knee Pain    both painful right is worse      86 year old female status post left total hip in 2010 went well was considering right total knee replacement had a stroke surgery was not recommended after that.  Patient has chronic right leg numbness question if it is coming from her lower back where she has degenerative disc disease  Comes in today with bilateral knee pain right greater than left difficulty with her activities of daily living hoping to get something to help her with her arthritis  She has had a right knee injection and hyaluronic acid injections with [facility] had her left knee aspirated she has tried some CBD gel and heat and ice, heat seems to do better.  Seems to get some relief from the gel.  She was told she could not use Voltaren gel question why      Review of Systems  Musculoskeletal: Positive for joint pain.  Neurological: Positive for tingling and sensory change.     Past Medical History:  Diagnosis Date  . Allergy   . Anxiety   . Cataract   . Cystocele 09/05/2013  . GERD (gastroesophageal reflux disease)   . Glaucoma   . Hemorrhoids 09/05/2013  . Hypertension   . Rectocele 09/05/2013   Past Surgical History:  Procedure Laterality Date  . EYE SURGERY Left    tube placed and cataract removed  . HIP SURGERY Left 2010    hip replacement.    Social History   Tobacco Use  . Smoking status: Passive Smoke Exposure - Never Smoker  . Smokeless tobacco: Never Used  Substance Use Topics  . Alcohol use: No  . Drug use: No    Family History  Problem Relation Age of Onset  . Cancer Mother   . Heart disease Father   . Heart attack Father   . Cancer Sister 68       breast   . Arthritis Daughter   . Asthma Daughter   . Diabetes Daughter   . Diabetes Paternal Grandmother   . Cancer Sister        colon   . Cancer Sister   . Stroke Sister   . Arthritis Sister   . Heart disease Sister   .  Kidney disease Sister   . Cancer Sister        breast and lung   . Diabetes Sister   . Vision loss Brother   . Diabetes Brother   . Arthritis Son   . Alcohol abuse Son   . Alcohol abuse Son   . Arthritis Son   . Depression Son   . Mental illness Son     BP (!) 115/92   Pulse 75   Ht 5\' 2"  (1.575 m)   Wt 144 lb (65.3 kg)   BMI 26.34 kg/m   Physical Exam Constitutional:      General: She is not in acute distress.    Appearance: She is well-developed.  Cardiovascular:     Comments: No peripheral edema Skin:    General: Skin is warm and dry.  Neurological:     Mental Status: She is alert and oriented to person, place, and time.     Sensory: No sensory deficit.     Coordination: Coordination normal.     Gait: Gait normal.     Deep Tendon Reflexes: Reflexes are normal and symmetric.    Right knee skin warm dry clean  strength normal stability normal motion flexion contracture 5 degrees flexion 115 degrees tenderness medial compartment swelling pes bursa neurovascular exam intact  Left knee skin is normal strength is normal stability tests were normal range of motion she also has a flexion contracture here she flexes knee to about 110 degrees her pes bursa is swollen as well neurovascular exam is intact  Outside films from 2020 January varus osteoarthritis right knee with mild deformity less than 10 degrees valgus alignment with osteoarthritis more medial compartment  After discussing with the patient and her daughter we recommend she continue cane use  Use Tylenol 500 mg every 6  Economy hinged brace both knees  Inject both knees  Procedure note for bilateral knee injections  Procedure note left knee injection verbal consent was obtained to inject left knee joint  Timeout was completed to confirm the site of injection  The medications used were 40 mg of Depo-Medrol and 1% lidocaine 3 cc  Anesthesia was provided by ethyl chloride and the skin was prepped with  alcohol.  After cleaning the skin with alcohol a 20-gauge needle was used to inject the left knee joint. There were no complications. A sterile bandage was applied.   Procedure note right knee injection verbal consent was obtained to inject right knee joint  Timeout was completed to confirm the site of injection  The medications used were 40 mg of Depo-Medrol and 1% lidocaine 3 cc  Anesthesia was provided by ethyl chloride and the skin was prepped with alcohol.  After cleaning the skin with alcohol a 20-gauge needle was used to inject the right knee joint. There were no complications. A sterile bandage was applied.

## 2020-05-02 NOTE — Patient Instructions (Addendum)
Use Aspercreme, Biofreeze l over the counter 2-3 times daily make sure you rub it in well each time you use it.   The CBD is also okay to use if it helps you  Use heat or ice, whichever helps most  Tylenol 500 mg every 6 hours for the knees/ legs   You have received an injection of steroids into the joint. 15% of patients will have increased pain within the 24 hours postinjection.   This is transient and will go away.   We recommend that you use ice packs on the injection site for 20 minutes every 2 hours and extra strength Tylenol 2 tablets every 8 as needed until the pain resolves.  If you continue to have pain after taking the Tylenol and using the ice please call the office for further instructions.

## 2020-06-26 ENCOUNTER — Encounter: Payer: Self-pay | Admitting: Obstetrics and Gynecology

## 2020-06-26 ENCOUNTER — Ambulatory Visit: Payer: Medicare HMO | Admitting: Obstetrics and Gynecology

## 2020-06-26 VITALS — BP 151/68 | HR 85 | Ht 63.0 in | Wt 148.6 lb

## 2020-06-26 DIAGNOSIS — N814 Uterovaginal prolapse, unspecified: Secondary | ICD-10-CM

## 2020-06-26 MED ORDER — GELLHORN PESSARY MISC: 2.0000 [in_us] | 0 refills | Status: AC

## 2020-06-26 NOTE — Progress Notes (Signed)
PATIENT ID: Krista Munoz, female     DOB: [birth year], 86 y.o.     MRN: [id number]   GYNECOLOGY CLINIC PROGRESS NOTE Pap normal in 2009 and 2014. The patient presented today for a pessary fitting. The patient was fitted for gelhorn 2" pessary with drain pessary. Speculum examination revealed normal vaginal mucosa with no lesions or lacerations. The patient should return in 3 months for a pessary check and continue to use vaginal estrogen cream weekly as prescribed. She reports no vaginal bleeding or discharge. She denies pelvic discomfort and difficulty urinating or moving her bowels.  She had earlier dx of prolapse by Dr Esmeralda Arthur many years ago but has not needed help with support til now. [name], MD [facility]              [facility] [address] [place] [address] Dept: [phone] Dept Fax: [phone]  By signing my name below, I, Mal Misty, attest that this documentation has been prepared under the direction and in the presence of [name], MD. Electronically Signed: Mal Misty Medical Scribe. 06/26/20. 9:39 AM.  I personally performed the services described in this documentation, which was SCRIBED in my presence. The recorded information has been reviewed and considered accurate. It has been edited as necessary during review. [name], MD

## 2020-07-11 ENCOUNTER — Ambulatory Visit: Payer: Medicare HMO | Admitting: Obstetrics and Gynecology

## 2020-07-18 ENCOUNTER — Ambulatory Visit: Payer: Medicare HMO | Admitting: Obstetrics and Gynecology

## 2020-07-27 ENCOUNTER — Ambulatory Visit (INDEPENDENT_AMBULATORY_CARE_PROVIDER_SITE_OTHER): Payer: Medicare HMO | Admitting: Obstetrics and Gynecology

## 2020-07-27 VITALS — BP 163/80 | HR 76 | Wt 148.4 lb

## 2020-07-27 DIAGNOSIS — Z4689 Encounter for fitting and adjustment of other specified devices: Secondary | ICD-10-CM | POA: Diagnosis not present

## 2020-07-27 NOTE — Progress Notes (Signed)
Patient ID: Krista Munoz, female   DOB: 1931-04-01, 86 y.o.   MRN: [id number]  GYNECOLOGY CLINIC PROGRESS NOTE  The patient presented today for a pessary insertion. She is accompanied by her daughter and Lowell Guitar The patient had trouble getting a Gelhorn pessary from [employer]. She was only able to get a ring pessary. She reports no vaginal bleeding or discharge. She denies pelvic discomfort and difficulty urinating or moving her bowels.  ?The patient's 2.25" ring pessary was placed without complications. Speculum examination revealed normal vaginal mucosa with no lesions or lacerations. Ashley innstructed in how to monitor pessary The patient should return in 3 months for a pessary check and continue to use vaginal estrogen cream weekly as prescribed.  By signing my name below, I, Pietro Cassis, attest that this documentation has been prepared under the direction and in the presence of [name], MD. Electronically Signed: Pietro Cassis, Medical Scribe. 07/27/20. 1:05 PM.  I personally performed the services described in this documentation, which was SCRIBED in my presence. The recorded information has been reviewed and considered accurate. It has been edited as necessary during review. [name], MD

## 2020-09-21 ENCOUNTER — Other Ambulatory Visit (HOSPITAL_COMMUNITY): Payer: Self-pay | Admitting: Internal Medicine

## 2020-09-21 DIAGNOSIS — Z1231 Encounter for screening mammogram for malignant neoplasm of breast: Secondary | ICD-10-CM

## 2020-09-25 ENCOUNTER — Encounter: Payer: Self-pay | Admitting: Obstetrics & Gynecology

## 2020-09-25 ENCOUNTER — Ambulatory Visit (INDEPENDENT_AMBULATORY_CARE_PROVIDER_SITE_OTHER): Payer: Medicare HMO | Admitting: Obstetrics & Gynecology

## 2020-09-25 ENCOUNTER — Other Ambulatory Visit: Payer: Self-pay

## 2020-09-25 VITALS — BP 172/75 | HR 79 | Ht 62.0 in | Wt 146.0 lb

## 2020-09-25 DIAGNOSIS — R300 Dysuria: Secondary | ICD-10-CM | POA: Diagnosis not present

## 2020-09-25 DIAGNOSIS — R350 Frequency of micturition: Secondary | ICD-10-CM | POA: Diagnosis not present

## 2020-09-25 LAB — POCT URINALYSIS DIPSTICK
Glucose, UA: NEGATIVE
Ketones, UA: NEGATIVE
Nitrite, UA: NEGATIVE
Protein, UA: NEGATIVE

## 2020-09-25 NOTE — Progress Notes (Signed)
Chief Complaint  Patient presents with  . pessary insertion    Blood pressure (!) 172/75, pulse 79, height 5\' 2"  (1.575 m), weight 146 lb (66.2 kg).  presents today for routine follow up related to her pessary.   She uses a Milex ring with support #2 She reports feeling pressure in her rectum a pain really She reports no vaginal discharge or vaginal bleeding.  Exam reveals no undue vaginal mucosal pressure of breakdown, no discharge and no vaginal bleeding. I do think the pessary is too small and I fit her for a #4 Milex ring The pessary is removed, cleaned and replaced without difficulty.    SINIA ANTOSH will be sen back in 2 weeks for continued follow up.  [name], MD  09/25/2020 3:28 PM

## 2020-10-04 ENCOUNTER — Other Ambulatory Visit: Payer: Self-pay

## 2020-10-04 ENCOUNTER — Ambulatory Visit (HOSPITAL_COMMUNITY)
Admission: RE | Admit: 2020-10-04 | Discharge: 2020-10-04 | Disposition: A | Discharge status: Home / Self Care | Payer: Medicare HMO | Source: Ambulatory Visit | Attending: Internal Medicine | Admitting: Internal Medicine

## 2020-10-04 DIAGNOSIS — Z1231 Encounter for screening mammogram for malignant neoplasm of breast: Secondary | ICD-10-CM | POA: Insufficient documentation

## 2020-10-09 ENCOUNTER — Ambulatory Visit (INDEPENDENT_AMBULATORY_CARE_PROVIDER_SITE_OTHER): Payer: Medicare HMO | Admitting: Obstetrics & Gynecology

## 2020-10-09 ENCOUNTER — Encounter: Payer: Self-pay | Admitting: Obstetrics & Gynecology

## 2020-10-09 ENCOUNTER — Other Ambulatory Visit: Payer: Self-pay

## 2020-10-09 VITALS — BP 135/69 | HR 79 | Wt 145.0 lb

## 2020-10-09 DIAGNOSIS — N814 Uterovaginal prolapse, unspecified: Secondary | ICD-10-CM

## 2020-10-09 DIAGNOSIS — Z4689 Encounter for fitting and adjustment of other specified devices: Secondary | ICD-10-CM

## 2020-10-09 NOTE — Progress Notes (Signed)
Chief Complaint  Patient presents with  . Pessary Check    Blood pressure 135/69, pulse 79, weight 145 lb (65.8 kg).  Krista Munoz presents today for routine follow up related to her pessary.   She uses a Milex ring with support #4 She reports no vaginal discharge or vaginal bleeding.  Exam reveals no undue vaginal mucosal pressure of breakdown, no discharge and no vaginal bleeding.  The pessary is removed, cleaned and replaced without difficulty.    TYAUNA LACAZE will be sen back in 6 weeks for continued follow up.    ICD-10-CM   1. Pessary maintenance, Milex ring with support #4, OF 10/21  Z46.89   2. Cystocele with uterine prolapse  N81.4      Lazaro Arms, MD  10/09/2020 3:27 PM

## 2020-11-26 ENCOUNTER — Encounter: Payer: Self-pay | Admitting: Obstetrics & Gynecology

## 2020-11-26 ENCOUNTER — Other Ambulatory Visit: Payer: Self-pay

## 2020-11-26 ENCOUNTER — Ambulatory Visit: Payer: Medicare HMO | Admitting: Obstetrics & Gynecology

## 2020-11-26 VITALS — BP 174/78 | HR 77 | Ht 60.5 in | Wt 144.5 lb

## 2020-11-26 DIAGNOSIS — Z4689 Encounter for fitting and adjustment of other specified devices: Secondary | ICD-10-CM | POA: Diagnosis not present

## 2020-11-26 DIAGNOSIS — N814 Uterovaginal prolapse, unspecified: Secondary | ICD-10-CM

## 2020-11-26 NOTE — Progress Notes (Signed)
Chief Complaint  Patient presents with  . Follow-up  . Pessary Check    Blood pressure (!) 174/78, pulse 77, height 5' 0.5" (1.537 m), weight 144 lb 8 oz (65.5 kg).  Krista Munoz presents today for routine follow up related to her pessary.   She uses a Milex ring with support #4 She reports no vaginal discharge and no vaginal bleeding   Likert scale(1 not bothersome -5 very bothersome)  :  1  Exam reveals no undue vaginal mucosal pressure of breakdown, no discharge and no vaginal bleeding.  Vaginal Epithelial Abnormality Classification System:   0 0    No abnormalities 1    Epithelial erythema 2    Granulation tissue 3    Epithelial break or erosion, 1 cm or less 4    Epithelial break or erosion, 1 cm or greater  The pessary is removed, cleaned and replaced without difficulty.      ICD-10-CM   1. Pessary maintenance, Milex ring with support #4, OF 10/21  Z46.89   2. Cystocele with uterine prolapse  N81.4      [name] will be sen back in 3 months for continued follow up.  Lazaro Arms, MD  11/26/2020 10:27 AM

## 2021-02-25 ENCOUNTER — Encounter: Payer: Self-pay | Admitting: Obstetrics & Gynecology

## 2021-02-25 ENCOUNTER — Other Ambulatory Visit: Payer: Self-pay

## 2021-02-25 ENCOUNTER — Ambulatory Visit (INDEPENDENT_AMBULATORY_CARE_PROVIDER_SITE_OTHER): Payer: Medicare HMO | Admitting: Obstetrics & Gynecology

## 2021-02-25 VITALS — BP 168/79 | HR 84 | Ht 60.5 in | Wt 142.0 lb

## 2021-02-25 DIAGNOSIS — Z4689 Encounter for fitting and adjustment of other specified devices: Secondary | ICD-10-CM | POA: Diagnosis not present

## 2021-02-25 DIAGNOSIS — N814 Uterovaginal prolapse, unspecified: Secondary | ICD-10-CM | POA: Diagnosis not present

## 2021-02-25 NOTE — Progress Notes (Signed)
Chief Complaint  Patient presents with  . Pessary Check    Blood pressure (!) 168/79, pulse 84, height 5' 0.5" (1.537 m), weight 142 lb (64.4 kg).  Krista Munoz presents today for routine follow up related to her pessary.   She uses a Milex ring with support #4 She reports no vaginal discharge and no vaginal bleeding   Likert scale(1 not bothersome -5 very bothersome)  :  1  Exam reveals no undue vaginal mucosal pressure of breakdown, no discharge and no vaginal bleeding.  Vaginal Epithelial Abnormality Classification System:   0 0    No abnormalities 1    Epithelial erythema 2    Granulation tissue 3    Epithelial break or erosion, 1 cm or less 4    Epithelial break or erosion, 1 cm or greater  The pessary is removed, cleaned and replaced without difficulty.      ICD-10-CM   1. Pessary maintenance, Milex ring with support #4, OF 10/21  Z46.89   2. Cystocele with uterine prolapse  N81.4      Krista Munoz will be sen back in 4 months for continued follow up.  Lazaro Arms, MD  02/25/2021 2:02 PM

## 2021-04-02 ENCOUNTER — Encounter: Payer: Self-pay | Admitting: Obstetrics & Gynecology

## 2021-04-08 DIAGNOSIS — F419 Anxiety disorder, unspecified: Secondary | ICD-10-CM | POA: Insufficient documentation

## 2021-04-08 DIAGNOSIS — E559 Vitamin D deficiency, unspecified: Secondary | ICD-10-CM | POA: Insufficient documentation

## 2021-04-08 DIAGNOSIS — Z87448 Personal history of other diseases of urinary system: Secondary | ICD-10-CM | POA: Insufficient documentation

## 2021-05-17 DIAGNOSIS — E119 Type 2 diabetes mellitus without complications: Secondary | ICD-10-CM | POA: Insufficient documentation

## 2021-05-17 DIAGNOSIS — R809 Proteinuria, unspecified: Secondary | ICD-10-CM | POA: Insufficient documentation

## 2021-06-17 ENCOUNTER — Ambulatory Visit: Payer: Medicare HMO | Admitting: Obstetrics & Gynecology

## 2021-06-17 ENCOUNTER — Encounter: Payer: Self-pay | Admitting: Obstetrics & Gynecology

## 2021-06-17 ENCOUNTER — Other Ambulatory Visit: Payer: Self-pay

## 2021-06-17 VITALS — BP 151/68 | HR 70 | Ht 60.5 in | Wt 135.0 lb

## 2021-06-17 DIAGNOSIS — N814 Uterovaginal prolapse, unspecified: Secondary | ICD-10-CM | POA: Diagnosis not present

## 2021-06-17 DIAGNOSIS — Z4689 Encounter for fitting and adjustment of other specified devices: Secondary | ICD-10-CM

## 2021-06-17 NOTE — Progress Notes (Signed)
Chief Complaint  Patient presents with   Pessary Check    Blood pressure (!) 151/68, pulse 70, height 5' 0.5" (1.537 m), weight 135 lb (61.2 kg).  Krista Munoz presents today for routine follow up related to her pessary.   She uses a Milex ring with support #4 She reports no vaginal discharge and no vaginal bleeding   Likert scale(1 not bothersome -5 very bothersome)  :  1  Exam reveals no undue vaginal mucosal pressure of breakdown, no discharge and no vaginal bleeding.  Vaginal Epithelial Abnormality Classification System:   0 0    No abnormalities 1    Epithelial erythema 2    Granulation tissue 3    Epithelial break or erosion, 1 cm or less 4    Epithelial break or erosion, 1 cm or greater  The pessary is removed, cleaned and replaced without difficulty.      ICD-10-CM   1. Pessary maintenance, Milex ring with support #4, OF 10/21  Z46.89     2. Cystocele with uterine prolapse  N81.4        Krista Munoz will be sen back in 4 months for continued follow up.  Lazaro Arms, MD  06/17/2021 12:06 PM

## 2021-06-27 ENCOUNTER — Ambulatory Visit: Payer: Medicare HMO | Admitting: Obstetrics & Gynecology

## 2021-07-10 ENCOUNTER — Other Ambulatory Visit: Payer: Self-pay | Admitting: Internal Medicine

## 2021-07-10 ENCOUNTER — Other Ambulatory Visit (HOSPITAL_COMMUNITY): Payer: Self-pay | Admitting: Internal Medicine

## 2021-07-10 DIAGNOSIS — G609 Hereditary and idiopathic neuropathy, unspecified: Secondary | ICD-10-CM

## 2021-07-17 ENCOUNTER — Ambulatory Visit (HOSPITAL_COMMUNITY)
Admission: RE | Admit: 2021-07-17 | Discharge: 2021-07-17 | Disposition: A | Discharge status: Home / Self Care | Payer: Medicare HMO | Source: Ambulatory Visit | Attending: Internal Medicine | Admitting: Internal Medicine

## 2021-07-17 ENCOUNTER — Other Ambulatory Visit: Payer: Self-pay

## 2021-07-17 DIAGNOSIS — G609 Hereditary and idiopathic neuropathy, unspecified: Secondary | ICD-10-CM | POA: Insufficient documentation

## 2021-09-25 ENCOUNTER — Encounter: Payer: Self-pay | Admitting: Cardiovascular Disease

## 2021-09-25 ENCOUNTER — Other Ambulatory Visit: Payer: Self-pay

## 2021-09-25 ENCOUNTER — Ambulatory Visit: Payer: Medicare HMO | Admitting: Cardiovascular Disease

## 2021-09-25 DIAGNOSIS — I739 Peripheral vascular disease, unspecified: Secondary | ICD-10-CM

## 2021-09-25 DIAGNOSIS — E782 Mixed hyperlipidemia: Secondary | ICD-10-CM | POA: Diagnosis not present

## 2021-09-25 DIAGNOSIS — I10 Essential (primary) hypertension: Secondary | ICD-10-CM | POA: Diagnosis not present

## 2021-09-25 NOTE — Progress Notes (Signed)
09/25/2021 ETHELMAE RINGEL   12-04-1930  [id number]  Primary Physician Benita Stabile, MD Primary Cardiologist: Runell Gess MD Milagros Loll, [place], [place]  HPI:  Krista Munoz is a 87 y.o. moderately overweight widowed Caucasian female mother of 2 living children (3 deceased), grandmother of over 110 grandchildren who was referred by Dr. Margo Aye, her PCP, for evaluation of PAD.  She is retired from working at [employer].  She does have a history of treated hypertension and hyperlipidemia.  She had a stroke back in 2014 leaving her with some right-sided deficits.  She walks with the aid of a walker and lives alone.  She denies chest pain or shortness of breath.  She does have chronic pain in her legs both at rest and with minimal ambulation which sounds more orthopedic or neurogenic.  She had lower extremity arterial Doppler studies performed at [facility] 07/17/2021 revealing a right ABI of 0.89 and a left of 0.88.   Current Meds  Medication Sig   ALPRAZolam (XANAX) 0.5 MG tablet Take 0.5 mg by mouth 3 (three) times daily as needed.    aspirin EC 81 MG tablet Take 162 mg by mouth daily. Swallow whole.   atorvastatin (LIPITOR) 40 MG tablet Take 1 tablet (40 mg total) by mouth daily at 6 PM.   bimatoprost (LUMIGAN) 0.03 % ophthalmic solution Place 1 drop into the left eye at bedtime.    brimonidine (ALPHAGAN P) 0.1 % SOLN Place 1 drop into both eyes 2 (two) times daily.    Cholecalciferol 125 MCG (5000 UT) TABS Take 1 tablet by mouth daily in the afternoon.   diphenhydrAMINE HCl (BENADRYL PO) Take by mouth.   dorzolamide-timolol (COSOPT) 22.3-6.8 MG/ML ophthalmic solution Place 1 drop into both eyes 2 (two) times daily.   losartan (COZAAR) 100 MG tablet Take 100 mg by mouth daily.   Misc. Devices (GELLHORN PESSARY) MISC 2 inches by Does not apply route continuous. Obtain at [facility]   Multiple Vitamins-Minerals (CENTRUM SILVER PO) Take 1 tablet by mouth.     pantoprazole (PROTONIX) 40 MG tablet Take 1 tablet (40 mg total) by mouth daily.     Allergies  Allergen Reactions   Amoxicillin Diarrhea   Morphine And Related Other (See Comments)    Altered mental status   Codeine Palpitations    Social History   Socioeconomic History   Marital status: Widowed    Spouse name: Not on file   Number of children: Not on file   Years of education: Not on file   Highest education level: Not on file  Occupational History   Not on file  Tobacco Use   Smoking status: Never    Passive exposure: Yes   Smokeless tobacco: Never  Vaping Use   Vaping Use: Never used  Substance and Sexual Activity   Alcohol use: No   Drug use: No   Sexual activity: Not Currently    Birth control/protection: Post-menopausal  Other Topics Concern   Not on file  Social History Narrative   Not on file   Social Determinants of Health   Financial Resource Strain: Not on file  Food Insecurity: Not on file  Transportation Needs: Not on file  Physical Activity: Not on file  Stress: Not on file  Social Connections: Not on file  Intimate Partner Violence: Not on file     Review of Systems: General: negative for chills, fever, night sweats or weight changes.  Cardiovascular: negative  for chest pain, dyspnea on exertion, edema, orthopnea, palpitations, paroxysmal nocturnal dyspnea or shortness of breath Dermatological: negative for rash Respiratory: negative for cough or wheezing Urologic: negative for hematuria Abdominal: negative for nausea, vomiting, diarrhea, bright red blood per rectum, melena, or hematemesis Neurologic: negative for visual changes, syncope, or dizziness All other systems reviewed and are otherwise negative except as noted above.    Blood pressure (!) 150/60, pulse 71, height 5\' 2"  (1.575 m), weight 134 lb 6.4 oz (61 kg).  General appearance: alert and no distress Neck: no adenopathy, no carotid bruit, no JVD, supple, symmetrical, trachea  midline, and thyroid not enlarged, symmetric, no tenderness/mass/nodules Lungs: clear to auscultation bilaterally Heart: regular rate and rhythm, S1, S2 normal, no murmur, click, rub or gallop Extremities: extremities normal, atraumatic, no cyanosis or edema Pulses: 2+ and symmetric Skin: Skin color, texture, turgor normal. No rashes or lesions Neurologic: Grossly normal  EKG sinus rhythm with right bundle branch block, left axis deviation and minimal criteria for LVH.  I personally reviewed this EKG.  ASSESSMENT AND PLAN:   HTN (hypertension) History of essential hypertension a blood pressure measured today at 150/60.  She is on losartan.  Hyperlipidemia History of hyperlipidemia on statin therapy with lipid profile performed 04/02/2021 revealing total cholesterol 149, LDL 71 and HDL 65.  Peripheral arterial disease [facility]) Ms. Aguinaga was referred to me by Dr. Iran Ouch for evaluation of PAD.  She has a history of treated hypertension and hyperlipidemia.  She had a stroke back in 2014 and had some right sided deficits.  She complains of chronic pain in her legs both at rest and with exertion.  She really does denies claudication symptoms.  Recent Dopplers performed at [facility] 07/17/2021 revealed a right ABI of 0.89 and a left ABI of 0.88.  There is no evidence of critical limb ischemia.  She does live alone but is fairly active for her age and works in her yard.  At this point, I do not think she needs further evaluation of PAD.     07/19/2021 MD FACP,FACC,FAHA, [facility] 09/25/2021 12:56 PM

## 2021-09-25 NOTE — Assessment & Plan Note (Signed)
History of essential hypertension a blood pressure measured today at 150/60.  She is on losartan.

## 2021-09-25 NOTE — Assessment & Plan Note (Signed)
History of hyperlipidemia on statin therapy with lipid profile performed 04/02/2021 revealing total cholesterol 149, LDL 71 and HDL 65.

## 2021-09-25 NOTE — Assessment & Plan Note (Signed)
Krista Munoz was referred to me by Dr. Margo Aye for evaluation of PAD.  She has a history of treated hypertension and hyperlipidemia.  She had a stroke back in 2014 and had some right sided deficits.  She complains of chronic pain in her legs both at rest and with exertion.  She really does denies claudication symptoms.  Recent Dopplers performed at [facility] 07/17/2021 revealed a right ABI of 0.89 and a left ABI of 0.88.  There is no evidence of critical limb ischemia.  She does live alone but is fairly active for her age and works in her yard.  At this point, I do not think she needs further evaluation of PAD.

## 2021-09-25 NOTE — Patient Instructions (Signed)

## 2021-10-09 ENCOUNTER — Ambulatory Visit: Payer: Medicare HMO | Admitting: Cardiovascular Disease

## 2021-10-17 ENCOUNTER — Ambulatory Visit: Payer: Medicare HMO | Admitting: Obstetrics & Gynecology

## 2021-10-17 ENCOUNTER — Other Ambulatory Visit: Payer: Self-pay

## 2021-10-17 ENCOUNTER — Encounter: Payer: Self-pay | Admitting: Obstetrics & Gynecology

## 2021-10-17 VITALS — BP 148/66 | HR 73 | Ht 60.0 in | Wt 130.0 lb

## 2021-10-17 DIAGNOSIS — N814 Uterovaginal prolapse, unspecified: Secondary | ICD-10-CM

## 2021-10-17 DIAGNOSIS — Z4689 Encounter for fitting and adjustment of other specified devices: Secondary | ICD-10-CM

## 2021-10-17 NOTE — Progress Notes (Signed)
Chief Complaint  Patient presents with   Pessary Check    Blood pressure (!) 148/66, pulse 73, height 5' (1.524 m), weight 130 lb (59 kg).  Krista Munoz presents today for routine follow up related to her pessary.   She uses a Milex ring with support #4 She reports no vaginal discharge and no vaginal bleeding   Likert scale(1 not bothersome -5 very bothersome)  :  1  Exam reveals no undue vaginal mucosal pressure of breakdown, no discharge and no vaginal bleeding.  Vaginal Epithelial Abnormality Classification System:   0 0    No abnormalities 1    Epithelial erythema 2    Granulation tissue 3    Epithelial break or erosion, 1 cm or less 4    Epithelial break or erosion, 1 cm or greater  The pessary is removed, cleaned and replaced without difficulty.      ICD-10-CM   1. Pessary maintenance, Milex ring with support #4, OF 10/21  Z46.89     2. Cystocele with uterine prolapse  N81.4        Krista Munoz will be sen back in 4 months for continued follow up.  Lazaro Arms, MD  10/17/2021 2:26 PM

## 2022-02-13 ENCOUNTER — Other Ambulatory Visit: Payer: Self-pay

## 2022-02-13 ENCOUNTER — Encounter: Payer: Self-pay | Admitting: Obstetrics & Gynecology

## 2022-02-13 ENCOUNTER — Ambulatory Visit: Payer: Medicare HMO | Admitting: Obstetrics & Gynecology

## 2022-02-13 VITALS — BP 169/72 | HR 76 | Ht 60.0 in

## 2022-02-13 DIAGNOSIS — Z4689 Encounter for fitting and adjustment of other specified devices: Secondary | ICD-10-CM | POA: Diagnosis not present

## 2022-02-13 DIAGNOSIS — N814 Uterovaginal prolapse, unspecified: Secondary | ICD-10-CM | POA: Diagnosis not present

## 2022-02-13 NOTE — Progress Notes (Signed)
Chief Complaint  ?Patient presents with  ? Pessary Check  ? ? ?Blood pressure (!) 169/72, pulse 76, height 5' (1.524 m). ? ?Krista Munoz presents today for routine follow up related to her pessary.   ?She uses a Milex ring with support #4 ?She reports no vaginal discharge and no vaginal bleeding  ? ?Likert scale(1 not bothersome -5 very bothersome)  :  1 ? ?Exam reveals no undue vaginal mucosal pressure of breakdown, no discharge and no vaginal bleeding. ? ?Vaginal Epithelial Abnormality Classification System:   0 ?0    No abnormalities ?1    Epithelial erythema ?2    Granulation tissue ?3    Epithelial break or erosion, 1 cm or less ?4    Epithelial break or erosion, 1 cm or greater ? ?The pessary is removed, cleaned and replaced without difficulty.   ? ?  ICD-10-CM   ?1. Pessary maintenance, Milex ring with support #4, OF 10/21  Z46.89   ?  ?2. Cystocele with uterine prolapse  N81.4   ?  ?  ? ?Krista Munoz will be sen back in 4 months for continued follow up. ? ?Florian Buff, MD  ?02/13/2022 ?10:28 AM ? ? ? ?

## 2022-06-16 ENCOUNTER — Ambulatory Visit: Payer: Medicare HMO | Admitting: Obstetrics & Gynecology

## 2022-06-17 ENCOUNTER — Ambulatory Visit: Payer: Medicare HMO | Admitting: Obstetrics & Gynecology

## 2022-06-17 ENCOUNTER — Encounter: Payer: Self-pay | Admitting: Obstetrics & Gynecology

## 2022-06-17 VITALS — BP 132/78 | HR 62

## 2022-06-17 DIAGNOSIS — Z4689 Encounter for fitting and adjustment of other specified devices: Secondary | ICD-10-CM | POA: Diagnosis not present

## 2022-06-17 DIAGNOSIS — R3989 Other symptoms and signs involving the genitourinary system: Secondary | ICD-10-CM | POA: Diagnosis not present

## 2022-06-17 DIAGNOSIS — N814 Uterovaginal prolapse, unspecified: Secondary | ICD-10-CM

## 2022-06-17 NOTE — Progress Notes (Signed)
     GYN VISIT Patient name: Krista Munoz MRN [id number]  Date of birth: [birth year] Chief Complaint:    Chief Complaint  Patient presents with   Pessary Check   History of Present Illness:   Krista Munoz is a 88 y.o. G5P5 PM, PH female being seen today for pessary maintenance  She reports no vaginal discharge and no vaginal bleeding   Likert scale(1 not bothersome -5 very bothersome)  :  1  Pt notes that her urine is dark.  She struggles with "staying wet" so she was not sure if her symptoms have changed.  Denies dysuria or hematuria.  Review of Systems:   Pertinent items are noted in HPI Denies fever/chills, dizziness, headaches, visual disturbances, fatigue, shortness of breath, chest pain, abdominal pain, vomiting, no problems with bowel movements, urination, or intercourse unless otherwise stated above.  Pertinent History Reviewed:  Reviewed past medical,surgical, social, obstetrical and family history.  Reviewed problem list, medications and allergies. Physical Assessment:   Vitals:   06/17/22 1004  BP: 132/78  Pulse: 62  There is no height or weight on file to calculate BMI.       Physical Examination:   General appearance: alert, well appearing, and in no distress  Psych: mood appropriate, normal affect  Skin: warm & dry   Cardiovascular: normal heart rate noted  Respiratory: normal respiratory effort, no distress  Abdomen: soft, non-tender   Exam reveals no undue vaginal mucosal pressure of breakdown, no discharge and no vaginal bleeding.  Pessary removed, irrigation completed.  No abnormalities noted.  Pessary replaced without difficulty  Vaginal Epithelial Abnormality Classification System:   0 0    No abnormalities 1    Epithelial erythema 2    Granulation tissue 3    Epithelial break or erosion, 1 cm or less 4    Epithelial break or erosion, 1 cm or greater  Extremities: no edema, no calf tenderness bilaterally  Chaperone:  Dr. Maury Dus      Assessment & Plan:     ICD-10-CM   1. Pessary maintenance, Milex ring with support #4, OF 10/21  Z46.89     2. Suspected UTI  R39.89 Urinalysis, Routine w reflex microscopic    Urine Culture    3. Cystocele with uterine prolapse  N81.4       -pessary maintenance completed without difficulty -UA and culture to be collected -f/u in 51mos as scheduled  Orders Placed This Encounter  Procedures   Urine Culture   Urinalysis, Routine w reflex microscopic    Return in about 4 months (around 10/18/2022) for pessary maintenance with Dr. Cephus Shelling.   Myna Hidalgo, DO Attending Obstetrician & Gynecologist, [facility] [employer], [facility]

## 2022-06-18 ENCOUNTER — Telehealth: Payer: Self-pay

## 2022-06-18 LAB — URINALYSIS, ROUTINE W REFLEX MICROSCOPIC
Bilirubin, UA: NEGATIVE
Glucose, UA: NEGATIVE
Ketones, UA: NEGATIVE
Leukocytes,UA: NEGATIVE
Nitrite, UA: NEGATIVE
Protein,UA: NEGATIVE
Specific Gravity, UA: 1.01 (ref 1.005–1.030)
Urobilinogen, Ur: 0.2 mg/dL (ref 0.2–1.0)
pH, UA: 7.5 (ref 5.0–7.5)

## 2022-06-18 LAB — MICROSCOPIC EXAMINATION
Bacteria, UA: NONE SEEN
Casts: NONE SEEN /lpf
Epithelial Cells (non renal): NONE SEEN /hpf (ref 0–10)
RBC, Urine: NONE SEEN /hpf (ref 0–2)
WBC, UA: NONE SEEN /hpf (ref 0–5)

## 2022-06-18 NOTE — Telephone Encounter (Signed)
Patients daughter called and wanted to speak with someone about her mother's visit on yesterday.  Would like for a nurse to call her.

## 2022-06-18 NOTE — Telephone Encounter (Signed)
Spoke with daughter, she was just wanting to check on what Dr. Charlotta Newton did yesterday. Advised that we just did a pessary cleaning but Dr. Charlotta Newton does it a little different than Dr. Despina Hidden. Pt is doing well. No other questions at this time.

## 2022-06-19 ENCOUNTER — Other Ambulatory Visit: Payer: Self-pay | Admitting: Obstetrics & Gynecology

## 2022-06-19 DIAGNOSIS — N3000 Acute cystitis without hematuria: Secondary | ICD-10-CM

## 2022-06-19 LAB — URINE CULTURE

## 2022-06-19 MED ORDER — NITROFURANTOIN MONOHYD MACRO 100 MG PO CAPS: 100.0000 mg | ORAL_CAPSULE | Freq: Two times a day (BID) | ORAL | 0 refills | Status: AC

## 2022-10-17 ENCOUNTER — Ambulatory Visit: Payer: Medicare HMO | Admitting: Obstetrics & Gynecology

## 2022-10-17 ENCOUNTER — Encounter: Payer: Self-pay | Admitting: Obstetrics & Gynecology

## 2022-10-17 VITALS — BP 131/68 | HR 78 | Ht 60.0 in | Wt 137.0 lb

## 2022-10-17 DIAGNOSIS — Z4689 Encounter for fitting and adjustment of other specified devices: Secondary | ICD-10-CM

## 2022-10-17 DIAGNOSIS — N814 Uterovaginal prolapse, unspecified: Secondary | ICD-10-CM

## 2022-10-17 NOTE — Progress Notes (Signed)
Chief Complaint  Patient presents with   Pessary Check    Blood pressure 131/68, pulse 78, height 5' (1.524 m), weight 137 lb (62.1 kg).  Krista Munoz presents today for routine follow up related to her pessary.   She uses a Milex ring with support #4 She reports no vaginal discharge and no vaginal bleeding   Likert scale(1 not bothersome -5 very bothersome)  :  1  Exam reveals no undue vaginal mucosal pressure of breakdown, no discharge and no vaginal bleeding.  Vaginal Epithelial Abnormality Classification System:   0 0    No abnormalities 1    Epithelial erythema 2    Granulation tissue 3    Epithelial break or erosion, 1 cm or less 4    Epithelial break or erosion, 1 cm or greater  The pessary is removed, cleaned and replaced without difficulty.      ICD-10-CM   1. Pessary maintenance, Milex ring with support #4, OF 10/21  Z46.89     2. Cystocele with uterine prolapse  N81.4        CATERIN TABARES will be sen back in 4 months for continued follow up.  Lazaro Arms, MD  10/17/2022 10:40 AM

## 2023-02-12 ENCOUNTER — Ambulatory Visit: Payer: Medicare HMO | Admitting: Obstetrics & Gynecology

## 2023-02-12 ENCOUNTER — Encounter: Payer: Self-pay | Admitting: Obstetrics & Gynecology

## 2023-02-12 VITALS — BP 138/71 | HR 77

## 2023-02-12 DIAGNOSIS — Z4689 Encounter for fitting and adjustment of other specified devices: Secondary | ICD-10-CM

## 2023-02-12 DIAGNOSIS — R300 Dysuria: Secondary | ICD-10-CM | POA: Diagnosis not present

## 2023-02-12 LAB — POCT URINALYSIS DIPSTICK OB
Glucose, UA: NEGATIVE
Ketones, UA: NEGATIVE
Nitrite, UA: NEGATIVE
POC,PROTEIN,UA: NEGATIVE

## 2023-02-12 NOTE — Progress Notes (Signed)
Chief Complaint  Patient presents with   Pessary Check    Blood pressure 138/71, pulse 77.  Krista Munoz presents today for routine follow up related to her pessary.   She uses a Milex ring with support #4 She reports no vaginal discharge and no vaginal bleeding   Likert scale(1 not bothersome -5 very bothersome)  :  1  Exam reveals no undue vaginal mucosal pressure of breakdown, no discharge and no vaginal bleeding.  Vaginal Epithelial Abnormality Classification System:   0 0    No abnormalities 1    Epithelial erythema 2    Granulation tissue 3    Epithelial break or erosion, 1 cm or less 4    Epithelial break or erosion, 1 cm or greater  The pessary is removed, cleaned and replaced without difficulty.      ICD-10-CM   1. Pessary maintenance, Milex ring with support #4, OF 10/21  Z46.89     2. Dysuria  R30.0 POC Urinalysis Dipstick OB    Urine Culture       Krista Munoz will be sen back in 4 months for continued follow up.  Florian Buff, MD  02/12/2023 11:53 AM

## 2023-02-14 LAB — URINE CULTURE

## 2023-06-30 ENCOUNTER — Ambulatory Visit: Payer: Medicare HMO | Admitting: Obstetrics & Gynecology

## 2023-06-30 ENCOUNTER — Encounter: Payer: Self-pay | Admitting: Obstetrics & Gynecology

## 2023-06-30 VITALS — BP 147/67 | HR 75 | Ht 61.0 in | Wt 142.0 lb

## 2023-06-30 DIAGNOSIS — Z4689 Encounter for fitting and adjustment of other specified devices: Secondary | ICD-10-CM | POA: Diagnosis not present

## 2023-06-30 DIAGNOSIS — N814 Uterovaginal prolapse, unspecified: Secondary | ICD-10-CM | POA: Diagnosis not present

## 2023-06-30 NOTE — Progress Notes (Signed)
Chief Complaint  Patient presents with   pessary maintenance    Blood pressure (!) 147/67, pulse 75, height 5\' 1"  (1.549 m), weight 142 lb (64.4 kg).  Krista Munoz presents today for routine follow up related to her pessary.   She uses a Milex ring with support #4 She reports no vaginal discharge and no vaginal bleeding   Likert scale(1 not bothersome -5 very bothersome)  :  1  Exam reveals no undue vaginal mucosal pressure of breakdown, no discharge and no vaginal bleeding.  Vaginal Epithelial Abnormality Classification System:   0 0    No abnormalities 1    Epithelial erythema 2    Granulation tissue 3    Epithelial break or erosion, 1 cm or less 4    Epithelial break or erosion, 1 cm or greater  The pessary is removed, cleaned and replaced without difficulty.      ICD-10-CM   1. Pessary maintenance, Milex ring with support #4, OF 10/21  Z46.89     2. Cystocele with uterine prolapse  N81.4        [name] will be sen back in 4 months for continued follow up.  Lazaro Arms, MD  06/30/2023 9:43 AM

## 2023-09-28 DIAGNOSIS — K5900 Constipation, unspecified: Secondary | ICD-10-CM | POA: Insufficient documentation

## 2023-09-28 DIAGNOSIS — R300 Dysuria: Secondary | ICD-10-CM | POA: Insufficient documentation

## 2023-10-19 ENCOUNTER — Ambulatory Visit: Payer: Medicare HMO | Attending: Cardiovascular Disease | Admitting: Cardiovascular Disease

## 2023-10-19 ENCOUNTER — Encounter: Payer: Self-pay | Admitting: Cardiovascular Disease

## 2023-10-19 VITALS — BP 148/60 | HR 71 | Ht 61.0 in | Wt 140.0 lb

## 2023-10-19 DIAGNOSIS — I10 Essential (primary) hypertension: Secondary | ICD-10-CM

## 2023-10-19 DIAGNOSIS — I739 Peripheral vascular disease, unspecified: Secondary | ICD-10-CM

## 2023-10-19 DIAGNOSIS — E782 Mixed hyperlipidemia: Secondary | ICD-10-CM

## 2023-10-19 NOTE — Patient Instructions (Signed)

## 2023-10-19 NOTE — Assessment & Plan Note (Signed)
History of essential hypertension blood pressure measured today at 174/68.  She does admit to have rushed on her way here.  She is on losartan and amlodipine.

## 2023-10-19 NOTE — Assessment & Plan Note (Signed)
History of hyperlipidemia on statin therapy with lipid profile performed 06/15/2023 revealing a total cholesterol 140 HDL 65 HDL 67.

## 2023-10-19 NOTE — Progress Notes (Signed)
10/19/2023 Krista Munoz   [birth year]  [id number]  Primary Physician Benita Stabile, MD Primary Cardiologist: Runell Gess MD Milagros Loll, [place], [place]  HPI:  Krista Munoz is a 89 y.o.  moderately overweight widowed Caucasian female mother of 2 living children (3 deceased), grandmother of over 5 grandchildren who was referred by Dr. Margo Aye, her PCP, for evaluation of PAD.  I last saw her in the office 09/25/2021.  She is accompanied by her daughter Zella Ball and daughter-in-law Olegario Messier today.  She is retired from working at [employer].  She does have a history of treated hypertension and hyperlipidemia.  She had a stroke back in 2014 leaving her with some right-sided deficits.  She walks with the aid of a walker and lives alone.  She denies chest pain or shortness of breath.  She does have chronic pain in her legs both at rest and with minimal ambulation which sounds more orthopedic or neurogenic.  She had lower extremity arterial Doppler studies performed at [facility] 07/17/2021 revealing a right ABI of 0.89 and a left of 0.88.  Since I saw her 2 years ago she has remained stable.  She does complain of some back and leg pain does not necessarily sound like claudication.  She denies chest pain or shortness of breath.     Current Meds  Medication Sig   acetaminophen (TYLENOL) 325 MG tablet Take 325 mg by mouth every 6 (six) hours as needed for pain.   ALPRAZolam (XANAX) 0.5 MG tablet Take 0.5 mg by mouth 3 (three) times daily as needed.    aspirin EC 81 MG tablet Take 162 mg by mouth daily. Swallow whole.   atorvastatin (LIPITOR) 40 MG tablet Take 1 tablet (40 mg total) by mouth daily at 6 PM.   bimatoprost (LUMIGAN) 0.03 % ophthalmic solution Place 1 drop into the left eye at bedtime.    brimonidine (ALPHAGAN P) 0.1 % SOLN Place 1 drop into both eyes 2 (two) times daily.    Cholecalciferol 125 MCG (5000 UT) TABS Take 1 tablet by mouth daily in the afternoon.    dorzolamide-timolol (COSOPT) 22.3-6.8 MG/ML ophthalmic solution Place 1 drop into both eyes 2 (two) times daily.   Loratadine (CLARITIN PO) Take 1 tablet by mouth as needed.   losartan (COZAAR) 100 MG tablet Take 100 mg by mouth daily.   Misc. Devices (GELLHORN PESSARY) MISC 2 inches by Does not apply route continuous. Obtain at [facility]   senna-docusate (SENOKOT-S) 8.6-50 MG tablet Take 1 tablet by mouth at bedtime as needed for mild constipation.   Vibegron (GEMTESA PO) Take 1 tablet by mouth daily at 6 (six) AM.     Allergies  Allergen Reactions   Amoxicillin Diarrhea   Morphine And Codeine Other (See Comments)    Altered mental status   Codeine Palpitations    Social History   Socioeconomic History   Marital status: Widowed    Spouse name: Not on file   Number of children: Not on file   Years of education: Not on file   Highest education level: Not on file  Occupational History   Not on file  Tobacco Use   Smoking status: Never    Passive exposure: Yes   Smokeless tobacco: Never  Vaping Use   Vaping status: Never Used  Substance and Sexual Activity   Alcohol use: No   Drug use: No   Sexual activity: Not Currently    Birth control/protection:  Post-menopausal  Other Topics Concern   Not on file  Social History Narrative   Not on file   Social Determinants of Health   Financial Resource Strain: Medium Risk (06/26/2020)   Overall Financial Resource Strain (CARDIA)    Difficulty of Paying Living Expenses: Somewhat hard  Food Insecurity: No Food Insecurity (06/26/2020)   Hunger Vital Sign    Worried About Running Out of Food in the Last Year: Never true    Ran Out of Food in the Last Year: Never true  Transportation Needs: No Transportation Needs (06/26/2020)   PRAPARE - Administrator, Civil Service (Medical): No    Lack of Transportation (Non-Medical): No  Physical Activity: Inactive (06/26/2020)   Exercise Vital Sign    Days of Exercise per  Week: 0 days    Minutes of Exercise per Session: 0 min  Stress: No Stress Concern Present (06/26/2020)   [employer] of Occupational Health - Occupational Stress Questionnaire    Feeling of Stress : Only a little  Social Connections: Unknown (06/26/2020)   Social Connection and Isolation Panel [NHANES]    Frequency of Communication with Friends and Family: More than three times a week    Frequency of Social Gatherings with Friends and Family: More than three times a week    Attends Religious Services: Not on file    Active Member of Clubs or Organizations: No    Attends Banker Meetings: Never    Marital Status: Widowed  Intimate Partner Violence: Not At Risk (06/26/2020)   Humiliation, Afraid, Rape, and Kick questionnaire    Fear of Current or Ex-Partner: No    Emotionally Abused: No    Physically Abused: No    Sexually Abused: No     Review of Systems: General: negative for chills, fever, night sweats or weight changes.  Cardiovascular: negative for chest pain, dyspnea on exertion, edema, orthopnea, palpitations, paroxysmal nocturnal dyspnea or shortness of breath Dermatological: negative for rash Respiratory: negative for cough or wheezing Urologic: negative for hematuria Abdominal: negative for nausea, vomiting, diarrhea, bright red blood per rectum, melena, or hematemesis Neurologic: negative for visual changes, syncope, or dizziness All other systems reviewed and are otherwise negative except as noted above.    Blood pressure (!) 174/68, pulse 71, height 5\' 1"  (1.549 m), weight 140 lb (63.5 kg), SpO2 96%.  General appearance: alert and no distress Neck: no adenopathy, no carotid bruit, no JVD, supple, symmetrical, trachea midline, and thyroid not enlarged, symmetric, no tenderness/mass/nodules Lungs: clear to auscultation bilaterally Heart: regular rate and rhythm, S1, S2 normal, no murmur, click, rub or gallop Extremities: extremities normal, atraumatic,  no cyanosis or edema Pulses: 2+ and symmetric Skin: Skin color, texture, turgor normal. No rashes or lesions Neurologic: Grossly normal  EKG EKG Interpretation Date/Time:  Monday October 19 2023 11:39:00 EST Ventricular Rate:  71 PR Interval:  224 QRS Duration:  118 QT Interval:  410 QTC Calculation: 445 R Axis:   -54  Text Interpretation: Sinus rhythm with 1st degree A-V block Left anterior fascicular block Left ventricular hypertrophy with QRS widening ( R in aVL , Cornell product ) Possible Lateral infarct , age undetermined Cannot rule out Inferior infarct (masked by fascicular block?) , age undetermined When compared with ECG of 09-Oct-2016 19:53, PREVIOUS ECG IS PRESENT Confirmed by Nanetta Batty [phone]) on 10/19/2023 12:22:40 PM    ASSESSMENT AND PLAN:   HTN (hypertension) History of essential hypertension blood pressure measured today at 174/68.  She  does admit to have rushed on her way here.  She is on losartan and amlodipine.  Hyperlipidemia History of hyperlipidemia on statin therapy with lipid profile performed 06/15/2023 revealing a total cholesterol 140 HDL 65 HDL 67.     Runell Gess MD [facility], [facility] 10/19/2023 12:29 PM

## 2023-10-23 ENCOUNTER — Encounter: Payer: Self-pay | Admitting: Obstetrics & Gynecology

## 2023-10-23 ENCOUNTER — Ambulatory Visit: Payer: Medicare HMO | Admitting: Obstetrics & Gynecology

## 2023-10-23 VITALS — BP 135/71 | HR 67 | Ht 61.0 in | Wt 141.0 lb

## 2023-10-23 DIAGNOSIS — Z4689 Encounter for fitting and adjustment of other specified devices: Secondary | ICD-10-CM | POA: Diagnosis not present

## 2023-10-23 DIAGNOSIS — N763 Subacute and chronic vulvitis: Secondary | ICD-10-CM | POA: Diagnosis not present

## 2023-10-23 DIAGNOSIS — R300 Dysuria: Secondary | ICD-10-CM

## 2023-10-23 LAB — POCT URINALYSIS DIPSTICK OB
Glucose, UA: NEGATIVE
Ketones, UA: NEGATIVE
Nitrite, UA: NEGATIVE
POC,PROTEIN,UA: NEGATIVE

## 2023-10-23 MED ORDER — GERHARDT'S BUTT CREAM: TOPICAL_CREAM | Freq: Two times a day (BID) | CUTANEOUS | Status: AC

## 2023-10-23 NOTE — Progress Notes (Signed)
Chief Complaint  Patient presents with   Pessary Check    Burning with urination Some spotting bright red two days ago and some other times recently.     Blood pressure 135/71, pulse 67, height 5\' 1"  (1.549 m), weight 141 lb (64 kg).  Krista Munoz presents today for routine follow up related to her pessary.   She uses a Milex ring with support #4 She reports no vaginal discharge and little vaginal bleeding   Likert scale(1 not bothersome -5 very bothersome)  :  2(spotting)  Exam reveals no undue vaginal mucosal pressure of breakdown, no discharge and little vaginal bleeding.  Vaginal Epithelial Abnormality Classification System:   0 0    No abnormalities 1    Epithelial erythema 2    Granulation tissue 3    Epithelial break or erosion, 1 cm or less 4    Epithelial break or erosion, 1 cm or greater  The pessary is removed, cleaned and replaced without difficulty.      ICD-10-CM   1. Pessary maintenance, Milex ring with support #4, OF 10/21  Z46.89     2. Burning with urination  R30.0 POC Urinalysis Dipstick OB    3. Pad vulvitis: Rx fanny cream  N76.3        Meds ordered this encounter  Medications   Gerhardt's butt cream     ERNESTO THWAITES will be sen back in 4 months for continued follow up.  Lazaro Arms, MD  10/23/2023 12:02 PM

## 2024-03-08 ENCOUNTER — Ambulatory Visit: Admitting: Obstetrics & Gynecology

## 2024-03-08 VITALS — BP 135/61 | HR 60

## 2024-03-08 DIAGNOSIS — Z4689 Encounter for fitting and adjustment of other specified devices: Secondary | ICD-10-CM

## 2024-03-13 ENCOUNTER — Encounter: Payer: Self-pay | Admitting: Obstetrics & Gynecology

## 2024-03-13 NOTE — Progress Notes (Signed)
 Chief Complaint  Patient presents with   Pessary Check    Blood pressure 135/61, pulse 60.  Krista Munoz presents today for routine follow up related to her pessary.   She uses a Milex ring with support #4 She reports no vaginal discharge and no vaginal bleeding   Likert scale(1 not bothersome -5 very bothersome)  :  1  Exam reveals no undue vaginal mucosal pressure of breakdown, no discharge and no vaginal bleeding.  Vaginal Epithelial Abnormality Classification System:   0 0    No abnormalities 1    Epithelial erythema 2    Granulation tissue 3    Epithelial break or erosion, 1 cm or less 4    Epithelial break or erosion, 1 cm or greater  The pessary is removed, cleaned and replaced without difficulty.      ICD-10-CM   1. Pessary maintenance, Milex ring with support #4, OF 10/21  Z46.89        [name] will be sen back in 4 months for continued follow up.  Wendelyn Halter, MD  03/13/2024 4:24 PM

## 2024-04-11 ENCOUNTER — Other Ambulatory Visit: Payer: Self-pay

## 2024-04-11 ENCOUNTER — Ambulatory Visit: Admitting: Orthopedic Surgery

## 2024-04-11 VITALS — BP 183/74 | HR 76 | Ht 60.0 in | Wt 140.0 lb

## 2024-04-11 DIAGNOSIS — M75111 Incomplete rotator cuff tear or rupture of right shoulder, not specified as traumatic: Secondary | ICD-10-CM | POA: Diagnosis not present

## 2024-04-11 DIAGNOSIS — M25562 Pain in left knee: Secondary | ICD-10-CM

## 2024-04-11 DIAGNOSIS — M1711 Unilateral primary osteoarthritis, right knee: Secondary | ICD-10-CM

## 2024-04-11 DIAGNOSIS — G8929 Other chronic pain: Secondary | ICD-10-CM | POA: Diagnosis not present

## 2024-04-11 MED ORDER — METHYLPREDNISOLONE ACETATE 40 MG/ML IJ SUSP
40.0000 mg | Freq: Once | INTRAMUSCULAR | Status: AC
  Administered 2024-04-11: 40 mg via INTRA_ARTICULAR

## 2024-04-11 MED ORDER — MELOXICAM 7.5 MG PO TABS: 7.5000 mg | ORAL_TABLET | ORAL | 5 refills | Status: AC

## 2024-04-11 NOTE — Patient Instructions (Signed)
 You have received an injection of steroids into the joint. 15% of patients will have increased pain within the 24 hours postinjection.   This is transient and will go away.   We recommend that you use ice packs on the injection site for 20 minutes every 2 hours and extra strength Tylenol 2 tablets every 8 as needed until the pain resolves.  If you continue to have pain after taking the Tylenol and using the ice please call the office for further instructions.

## 2024-04-11 NOTE — Progress Notes (Signed)
 Chief Complaint  Patient presents with   Knee Pain    Right knee all the time day and night Left not as bad    Shoulder Pain    Right- hurting for years can't raise the arm    Patient is new again have not seen her since 2021  Right knee pain  Right shoulder pain  Known history of severe arthritis right knee history of injections including cortisone and hyaluronic acid  Known history of rotator cuff insufficiency right shoulder with now worsening pain  She says she is in severe knee pain but she does not want to take any medication she is afraid Tylenol  will affect her liver She is not on any arthritis medication such as an NSAID  She is now ambulatory with a walker  Deteriorating function in terms of distance walked and speed of walking  Right knee flexion contracture major crepitance on range of motion Flexion 110 Medial compartment tenderness No effusion Varus knee   Right shoulder Painful crepitance loss of normal acromial humeral head distance Decreased active and passive range of motion   No real treatment I can offer her other than what we went over today  Ice or heat Topicals Tylenol  500 every 6 as needed NSAIDs every other day to preserve kidney function Steroid injections   Procedure note right knee injection   verbal consent was obtained to inject right knee joint  Timeout was completed to confirm the site of injection  The medications used were depomedrol 40 mg and 1% lidocaine 3 cc Anesthesia was provided by ethyl chloride and the skin was prepped with alcohol.  After cleaning the skin with alcohol a 20-gauge needle was used to inject the right knee joint. There were no complications. A sterile bandage was applied.    Procedure note the subacromial injection shoulder RIGHT    Verbal consent was obtained to inject the  RIGHT   Shoulder  Timeout was completed to confirm the injection site is a subacromial space of the  RIGHT   shoulder   Medication used Depo-Medrol  40 mg and lidocaine 1% 3 cc  Anesthesia was provided by ethyl chloride  The injection was performed in the RIGHT  posterior subacromial space. After pinning the skin with alcohol and anesthetized the skin with ethyl chloride the subacromial space was injected using a 20-gauge needle. There were no complications  Sterile dressing was applied.

## 2024-04-11 NOTE — Progress Notes (Signed)
  Intake history:  There were no vitals taken for this visit. There is no height or weight on file to calculate BMI.    WHAT ARE WE SEEING YOU FOR TODAY?   bilateral knee(Right worse than left  How Krista Munoz has this bothered you? (DOI?DOS?WS?)  several year(s) ago  Anticoag.  Yes  Diabetes No  Heart disease No  Hypertension Yes  SMOKING HX No  Kidney disease No  Any ALLERGIES ______________________________________________   Treatment:  Have you taken:  Tylenol  Yes  Advil No  Had PT No  Had injection yes years ago didn't help  Other  ____voltaren and aspercreme_____________________

## 2024-06-14 DIAGNOSIS — E1165 Type 2 diabetes mellitus with hyperglycemia: Secondary | ICD-10-CM | POA: Diagnosis not present

## 2024-06-14 DIAGNOSIS — I10 Essential (primary) hypertension: Secondary | ICD-10-CM | POA: Diagnosis not present

## 2024-06-21 DIAGNOSIS — R008 Other abnormalities of heart beat: Secondary | ICD-10-CM | POA: Diagnosis not present

## 2024-06-21 DIAGNOSIS — K5900 Constipation, unspecified: Secondary | ICD-10-CM | POA: Diagnosis not present

## 2024-06-21 DIAGNOSIS — E559 Vitamin D deficiency, unspecified: Secondary | ICD-10-CM | POA: Diagnosis not present

## 2024-06-21 DIAGNOSIS — E1165 Type 2 diabetes mellitus with hyperglycemia: Secondary | ICD-10-CM | POA: Diagnosis not present

## 2024-06-21 DIAGNOSIS — Z8673 Personal history of transient ischemic attack (TIA), and cerebral infarction without residual deficits: Secondary | ICD-10-CM | POA: Diagnosis not present

## 2024-06-21 DIAGNOSIS — R809 Proteinuria, unspecified: Secondary | ICD-10-CM | POA: Diagnosis not present

## 2024-06-21 DIAGNOSIS — K219 Gastro-esophageal reflux disease without esophagitis: Secondary | ICD-10-CM | POA: Diagnosis not present

## 2024-06-21 DIAGNOSIS — I10 Essential (primary) hypertension: Secondary | ICD-10-CM | POA: Diagnosis not present

## 2024-06-21 DIAGNOSIS — R300 Dysuria: Secondary | ICD-10-CM | POA: Diagnosis not present

## 2024-06-21 DIAGNOSIS — E785 Hyperlipidemia, unspecified: Secondary | ICD-10-CM | POA: Diagnosis not present

## 2024-06-21 DIAGNOSIS — R5383 Other fatigue: Secondary | ICD-10-CM | POA: Diagnosis not present

## 2024-06-30 DIAGNOSIS — L820 Inflamed seborrheic keratosis: Secondary | ICD-10-CM | POA: Diagnosis not present

## 2024-07-15 ENCOUNTER — Ambulatory Visit: Admitting: Obstetrics & Gynecology

## 2024-07-15 VITALS — BP 138/84 | HR 84 | Ht 60.0 in

## 2024-07-15 DIAGNOSIS — N763 Subacute and chronic vulvitis: Secondary | ICD-10-CM | POA: Diagnosis not present

## 2024-07-15 DIAGNOSIS — Z4689 Encounter for fitting and adjustment of other specified devices: Secondary | ICD-10-CM | POA: Diagnosis not present

## 2024-08-07 NOTE — Progress Notes (Signed)
 Chief Complaint  Patient presents with   Pessary Check    Blood pressure 138/84, pulse 84, height 5' (1.524 m).  Krista Munoz presents today for routine follow up related to her pessary.   She uses a Milex rign with support #4 She reports no vaginal discharge and no vaginal bleeding   Likert scale(1 not bothersome -5 very bothersome)  :  1  Exam reveals no undue vaginal mucosal pressure of breakdown, no discharge and no vaginal bleeding.  Vaginal Epithelial Abnormality Classification System:   0 0    No abnormalities 1    Epithelial erythema 2    Granulation tissue 3    Epithelial break or erosion, 1 cm or less 4    Epithelial break or erosion, 1 cm or greater  The pessary is removed, cleaned and replaced without difficulty.      ICD-10-CM   1. Pessary maintenance, Milex ring with support #4, OF 10/21  Z46.89     2. Pad vulvitis: Rx fanny cream  N76.3        Krista Munoz will be sen back in 4 months for continued follow up.  Vonn VEAR Inch, MD  08/07/2024 10:21 PM

## 2024-09-12 DIAGNOSIS — H401133 Primary open-angle glaucoma, bilateral, severe stage: Secondary | ICD-10-CM | POA: Diagnosis not present

## 2024-11-11 ENCOUNTER — Encounter: Payer: Self-pay | Admitting: Obstetrics & Gynecology

## 2024-11-11 ENCOUNTER — Ambulatory Visit: Admitting: Obstetrics & Gynecology

## 2024-11-11 VITALS — BP 156/80 | HR 82

## 2024-11-11 DIAGNOSIS — Z4689 Encounter for fitting and adjustment of other specified devices: Secondary | ICD-10-CM

## 2024-11-11 NOTE — Progress Notes (Signed)
 Chief Complaint  Patient presents with   Pessary Check    Blood pressure (!) 153/78, pulse 82.  Krista Munoz presents today for routine follow up related to her pessary.   She uses a Milex ring with support #4 She reports no vaginal discharge and no vaginal bleeding   Likert scale(1 not bothersome -5 very bothersome)  :  1  Exam reveals no undue vaginal mucosal pressure of breakdown, no discharge and no vaginal bleeding.  Vaginal Epithelial Abnormality Classification System:   0 0    No abnormalities 1    Epithelial erythema 2    Granulation tissue 3    Epithelial break or erosion, 1 cm or less 4    Epithelial break or erosion, 1 cm or greater  The pessary is removed, cleaned and replaced without difficulty.      ICD-10-CM   1. Pessary maintenance, Milex ring with support #4, OF 10/21  Z46.89        [name] will be sen back in 4 months for continued follow up.  Vonn VEAR Inch, MD  11/11/2024 10:44 AM
